# Patient Record
Sex: Male | Born: 1970 | Race: White | Hispanic: No | Marital: Married | Smoking: Former smoker
Health system: Southern US, Community
[De-identification: ages and names within clinical notes are randomized; demographics above are authoritative.]

## PROBLEM LIST (undated history)

## (undated) DIAGNOSIS — F32A Depression, unspecified: Secondary | ICD-10-CM

## (undated) DIAGNOSIS — F419 Anxiety disorder, unspecified: Secondary | ICD-10-CM

## (undated) HISTORY — PX: BACK SURGERY: SHX140

## (undated) HISTORY — DX: Anxiety disorder, unspecified: F41.9

## (undated) HISTORY — PX: CARPAL TUNNEL RELEASE: SHX101

## (undated) HISTORY — DX: Depression, unspecified: F32.A

---

## 2004-10-23 ENCOUNTER — Ambulatory Visit: Payer: Self-pay | Admitting: *Deleted

## 2005-08-01 ENCOUNTER — Emergency Department: Payer: Self-pay | Admitting: Emergency Medicine

## 2006-06-01 ENCOUNTER — Emergency Department: Payer: Self-pay | Admitting: Emergency Medicine

## 2006-06-02 ENCOUNTER — Other Ambulatory Visit: Payer: Self-pay

## 2006-08-13 ENCOUNTER — Emergency Department: Payer: Self-pay | Admitting: Emergency Medicine

## 2006-11-13 ENCOUNTER — Ambulatory Visit: Payer: Self-pay | Admitting: Unknown Physician Specialty

## 2006-11-20 ENCOUNTER — Ambulatory Visit: Payer: Self-pay | Admitting: Unknown Physician Specialty

## 2007-01-30 ENCOUNTER — Emergency Department: Payer: Self-pay | Admitting: Emergency Medicine

## 2010-01-07 ENCOUNTER — Emergency Department: Payer: Self-pay | Admitting: Internal Medicine

## 2012-10-26 ENCOUNTER — Ambulatory Visit: Payer: Self-pay | Admitting: Orthopedic Surgery

## 2012-11-09 ENCOUNTER — Ambulatory Visit: Payer: Self-pay | Admitting: Anesthesiology

## 2012-12-14 ENCOUNTER — Ambulatory Visit: Payer: Self-pay | Admitting: Anesthesiology

## 2014-12-26 ENCOUNTER — Emergency Department
Admission: EM | Admit: 2014-12-26 | Discharge: 2014-12-26 | Disposition: A | Discharge status: Home / Self Care | Payer: Medicare Other | Attending: Emergency Medicine | Admitting: Emergency Medicine

## 2014-12-26 ENCOUNTER — Encounter: Payer: Self-pay | Admitting: Emergency Medicine

## 2014-12-26 DIAGNOSIS — S39012A Strain of muscle, fascia and tendon of lower back, initial encounter: Secondary | ICD-10-CM | POA: Insufficient documentation

## 2014-12-26 DIAGNOSIS — X58XXXA Exposure to other specified factors, initial encounter: Secondary | ICD-10-CM | POA: Diagnosis not present

## 2014-12-26 DIAGNOSIS — S3992XA Unspecified injury of lower back, initial encounter: Secondary | ICD-10-CM | POA: Diagnosis present

## 2014-12-26 DIAGNOSIS — Z9889 Other specified postprocedural states: Secondary | ICD-10-CM | POA: Insufficient documentation

## 2014-12-26 DIAGNOSIS — Y998 Other external cause status: Secondary | ICD-10-CM | POA: Insufficient documentation

## 2014-12-26 DIAGNOSIS — Y9389 Activity, other specified: Secondary | ICD-10-CM | POA: Diagnosis not present

## 2014-12-26 DIAGNOSIS — Y9289 Other specified places as the place of occurrence of the external cause: Secondary | ICD-10-CM | POA: Insufficient documentation

## 2014-12-26 DIAGNOSIS — Z79899 Other long term (current) drug therapy: Secondary | ICD-10-CM | POA: Diagnosis not present

## 2014-12-26 MED ORDER — DIAZEPAM 2 MG PO TABS: 2.0000 mg | ORAL_TABLET | Freq: Three times a day (TID) | ORAL | Status: DC | PRN

## 2014-12-26 MED ORDER — OXYCODONE-ACETAMINOPHEN 5-325 MG PO TABS: 1.0000 | ORAL_TABLET | ORAL | Status: DC | PRN

## 2014-12-26 MED ORDER — DIAZEPAM 2 MG PO TABS
ORAL_TABLET | ORAL | Status: AC
  Filled 2014-12-26: qty 1

## 2014-12-26 MED ORDER — OXYCODONE-ACETAMINOPHEN 5-325 MG PO TABS
ORAL_TABLET | ORAL | Status: AC
  Filled 2014-12-26: qty 1

## 2014-12-26 MED ORDER — OXYCODONE-ACETAMINOPHEN 5-325 MG PO TABS
2.0000 | ORAL_TABLET | Freq: Once | ORAL | Status: AC
  Administered 2014-12-26: 2 via ORAL

## 2014-12-26 MED ORDER — DIAZEPAM 2 MG PO TABS
2.0000 mg | ORAL_TABLET | Freq: Once | ORAL | Status: AC
  Administered 2014-12-26: 2 mg via ORAL

## 2014-12-26 NOTE — Discharge Instructions (Signed)
° ° °  CALL YOUR DOCTOR IN [place] IF NOT IMPROVING IN 2-3 DAYS MOIST HEAT OR ICE TO BACK FREQUENTLY USE 2 PILLOWS UNDER YOUR KNEES IF YOU ARE SLEEPING ON YOUR BACK USE 1 PILLOW BETWEEN YOUR KNEES IF SLEEPING ON YOUR SIDE RETURN to emergency room if any loss of bowel or bladder function

## 2014-12-26 NOTE — ED Provider Notes (Signed)
[facility] Emergency Department Provider Note  ____________________________________________  Time seen: 1603  I have reviewed the triage vital signs and the nursing notes.   HISTORY  Chief Complaint Back Pain   HPI Aaron KeasRufus D Ebel Jr. is a 44 y.o. male comes in today with complaint of back pain for 4 days. Patient states that he and some buddies were moving a carport when his back began hurting. He is taken some over-the-counter medication without any relief. Pain increases with standing decreases only minimally lying down. He is unable to sleep due to discomfort. He denies any bowel or bladder incontinence. There is no radiation of pain. Currently his pain is 10 out of 10. 2 years ago he had back surgery in [place] but does not know the name of orthopedist. Since that time he has not required any pain medication.  History reviewed. No pertinent past medical history.  There are no active problems to display for this patient.   Past Surgical History  Procedure Laterality Date  . Back surgery      Current Outpatient Rx  Name  Route  Sig  Dispense  Refill  . DULoxetine (CYMBALTA) 30 MG capsule   Oral   Take 30 mg by mouth daily.         . QUEtiapine (SEROQUEL) 200 MG tablet   Oral   Take 200 mg by mouth at bedtime.         . diazepam (VALIUM) 2 MG tablet   Oral   Take 1 tablet (2 mg total) by mouth every 8 (eight) hours as needed for anxiety.   9 tablet   0   . oxyCODONE-acetaminophen (PERCOCET/ROXICET) 5-325 MG per tablet   Oral   Take 1-2 tablets by mouth every 4 (four) hours as needed for severe pain.   20 tablet   0     Allergies Review of patient's allergies indicates no known allergies.  No family history on file.  Social History History  Substance Use Topics  . Smoking status: Never Smoker   . Smokeless tobacco: Not on file  . Alcohol Use: Yes    Review of Systems Constitutional: No fever/chills Cardiovascular: Denies  chest pain. Respiratory: Denies shortness of breath. Gastrointestinal: No abdominal pain.  No nausea, no vomiting.  No diarrhea or bowel incontinence.  No constipation. Genitourinary: Negative for dysuria. Musculoskeletal: Positive for back pain. Skin: Negative for rash. Neurological: Negative for headaches, focal weakness or numbness. 10-point ROS otherwise negative.  ____________________________________________   PHYSICAL EXAM:  VITAL SIGNS: ED Triage Vitals  Enc Vitals Group     BP 12/26/14 1435 151/86 mmHg     Pulse Rate 12/26/14 1435 76     Resp 12/26/14 1435 16     Temp 12/26/14 1435 98.2 F (36.8 C)     Temp Source 12/26/14 1435 Oral     SpO2 12/26/14 1435 99 %     Weight 12/26/14 1435 245 lb (111.131 kg)     Height 12/26/14 1435 5\' 9"  (1.753 m)     Head Cir --      Peak Flow --      Pain Score 12/26/14 1436 10     Pain Loc --      Pain Edu? --      Excl. in GC? --     Constitutional: Alert and oriented. Well appearing and in no acute distress. Eyes: Conjunctivae are normal. PERRL. EOMI. Head: Atraumatic. Nose: No congestion/rhinnorhea. Neck: No stridor.  Supple Cardiovascular:  Normal rate, regular rhythm. Grossly normal heart sounds.  Good peripheral circulation. Respiratory: Normal respiratory effort.  No retractions. Lungs CTAB. Gastrointestinal: Soft and nontender. No distention. No abdominal bruits. No CVA tenderness. Musculoskeletal: No lower extremity tenderness nor edema.  No joint effusions. Back exam no gross deformity. Range of motion is restricted secondary to muscle spasms and pain. Moderate tenderness on palpation paravertebral muscles L5-S1 area. Straight leg raises are approximately 70 with discomfort bilaterally Neurologic:  Normal speech and language. No gross focal neurologic deficits are appreciated. Speech is normal. No gait instability. Reflexes lower extremities 2+ bilaterally Skin:  Skin is warm, dry and intact. No rash noted. Psychiatric:  Mood and affect are normal. Speech and behavior are normal.  ____________________________________________   LABS (all labs ordered are listed, but only abnormal results are displayed)  Labs Reviewed - No data to display  PROCEDURES  Procedure(s) performed: None  Critical Care performed: No  ____________________________________________   INITIAL IMPRESSION / ASSESSMENT AND PLAN / ED COURSE  Pertinent labs & imaging results that were available during my care of the patient were reviewed by me and considered in my medical decision making (see chart for details).  Patient is to follow-up with his orthopedist in [place]. He is reassured that this is mostly muscle related. He was offered injection of Toradol and is afraid of needles. He is given a prescription for Percocet 5 mg and diazepam 2 mg. he is also to use warm heat or ice to his lower back. ____________________________________________   FINAL CLINICAL IMPRESSION(S) / ED DIAGNOSES  Final diagnoses:  Low back strain, initial encounter      Tommi Rumps, PA-C 12/26/14 1632  Sharman Cheek, MD 12/27/14 1204

## 2014-12-26 NOTE — ED Notes (Signed)
Several days ago moving a metal carport developed low back pain

## 2014-12-26 NOTE — ED Notes (Signed)
Says moved a car port last Thursday.  Back is still hurting.  Increases with standing.

## 2015-05-21 ENCOUNTER — Emergency Department
Admission: EM | Admit: 2015-05-21 | Discharge: 2015-05-21 | Disposition: A | Discharge status: Home / Self Care | Payer: Medicare Other | Attending: Emergency Medicine | Admitting: Emergency Medicine

## 2015-05-21 ENCOUNTER — Encounter: Payer: Self-pay | Admitting: Emergency Medicine

## 2015-05-21 DIAGNOSIS — S0501XA Injury of conjunctiva and corneal abrasion without foreign body, right eye, initial encounter: Secondary | ICD-10-CM

## 2015-05-21 DIAGNOSIS — Y998 Other external cause status: Secondary | ICD-10-CM | POA: Insufficient documentation

## 2015-05-21 DIAGNOSIS — Y9389 Activity, other specified: Secondary | ICD-10-CM | POA: Insufficient documentation

## 2015-05-21 DIAGNOSIS — Z87891 Personal history of nicotine dependence: Secondary | ICD-10-CM | POA: Diagnosis not present

## 2015-05-21 DIAGNOSIS — S0591XA Unspecified injury of right eye and orbit, initial encounter: Secondary | ICD-10-CM | POA: Diagnosis present

## 2015-05-21 DIAGNOSIS — Y9289 Other specified places as the place of occurrence of the external cause: Secondary | ICD-10-CM | POA: Insufficient documentation

## 2015-05-21 DIAGNOSIS — T1501XA Foreign body in cornea, right eye, initial encounter: Secondary | ICD-10-CM | POA: Insufficient documentation

## 2015-05-21 DIAGNOSIS — Z79899 Other long term (current) drug therapy: Secondary | ICD-10-CM | POA: Insufficient documentation

## 2015-05-21 DIAGNOSIS — X58XXXA Exposure to other specified factors, initial encounter: Secondary | ICD-10-CM | POA: Insufficient documentation

## 2015-05-21 MED ORDER — GENTAMICIN SULFATE 0.3 % OP SOLN: 1.0000 [drp] | OPHTHALMIC | Status: DC

## 2015-05-21 MED ORDER — KETOROLAC TROMETHAMINE 0.5 % OP SOLN: 1.0000 [drp] | Freq: Four times a day (QID) | OPHTHALMIC | Status: DC

## 2015-05-21 MED ORDER — FLUORESCEIN SODIUM 1 MG OP STRP
1.0000 | ORAL_STRIP | Freq: Once | OPHTHALMIC | Status: AC
  Administered 2015-05-21: 1 via OPHTHALMIC
  Filled 2015-05-21: qty 1

## 2015-05-21 MED ORDER — TETRACAINE HCL 0.5 % OP SOLN
1.0000 [drp] | Freq: Once | OPHTHALMIC | Status: AC
  Administered 2015-05-21: 1 [drp] via OPHTHALMIC
  Filled 2015-05-21: qty 2
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
## 2015-05-21 NOTE — ED Notes (Signed)
Pt reports for past 2 days right eye redness and itching. Denies fever

## 2015-05-21 NOTE — Discharge Instructions (Signed)
Eye Foreign Body A foreign body is an object on or in the eye that should not be there. The object could be a speck of dirt or dust, a hair, an eyelash, a splinter, or any other object. HOME CARE  Take medicines only as told by your doctor. Use eye drops or ointment as told.  If no eye patch was put on:  Keep the eye closed as much as possible.  Do not rub the eye.  Wear dark glasses in bright light.  Do not wear contact lenses until the eye feels normal, or as told by your doctor.  Wear protective eye covering when needed, especially when using high-speed tools.  If your eye is patched:  Follow your doctor's instructions for when to remove the patch.  Do notdrive or use machines while the eye patch is on. Judging distances is hard to do while wearing a patch.  Keep all follow-up visits as told by your doctor. This is important. GET HELP IF:   Your pain gets worse.  Your vision gets worse.  You have problems with your eye patch.  You have fluid (discharge) coming from your eye.  You have redness and swelling around your eye. MAKE SURE YOU:   Understand these instructions.  Will watch your condition.  Will get help right away if you are not doing well or get worse.   This information is not intended to replace advice given to you by your health care provider. Make sure you discuss any questions you have with your health care provider.   Document Released: 01/09/2010 Document Revised: 08/12/2014 Document Reviewed: 12/17/2012 Elsevier Interactive Patient Education 2016 [employer]  Corneal Abrasion The cornea is the clear covering at the front and center of the eye. When you look at the colored portion of the eye, you are looking through the cornea. It is a thin tissue made up of layers. The top layer is the most sensitive layer. A corneal abrasion happens if this layer is scratched or an injury causes it to come off.  HOME CARE  You may be given drops or a  medicated cream. Use the medicine as told by your doctor.  A pressure patch may be put over the eye. If this is done, follow your doctor's instructions for when to remove the patch. Do not drive or use machines while the eye patch is on. Judging distances is hard to do with a patch on.  See your doctor for a follow-up exam if you are told to do so. It is very important that you keep this appointment. GET HELP IF:   You have pain, are sensitive to light, and have a scratchy feeling in one eye or both eyes.  Your pressure patch keeps getting loose. You can blink your eye under the patch.  You have fluid coming from your eye or the lids stick together in the morning.  You have the same symptoms in the morning that you did with the first abrasion. This could be days, weeks, or months after the first abrasion healed.   This information is not intended to replace advice given to you by your health care provider. Make sure you discuss any questions you have with your health care provider.   Document Released: 01/08/2008 Document Revised: 04/12/2015 Document Reviewed: 03/29/2013 Elsevier Interactive Patient Education 2016 [employer]   Use the eye drops as directed for corneal injury.  Follow-up with your eyecare provider or [facility].

## 2015-05-21 NOTE — ED Notes (Signed)
AAOx3.  Skin warm and dry.  NAD 

## 2015-05-21 NOTE — ED Notes (Signed)
Foreign body sensation to right eye. Conjunctiva red. No drainage. Patient states it gets worse if he "messes with it." No other sxs.

## 2015-07-30 ENCOUNTER — Encounter: Payer: Self-pay | Admitting: *Deleted

## 2015-07-30 ENCOUNTER — Emergency Department
Admission: EM | Admit: 2015-07-30 | Discharge: 2015-07-30 | Disposition: A | Discharge status: Home / Self Care | Payer: Medicare Other | Attending: Emergency Medicine | Admitting: Emergency Medicine

## 2015-07-30 DIAGNOSIS — Y9389 Activity, other specified: Secondary | ICD-10-CM | POA: Insufficient documentation

## 2015-07-30 DIAGNOSIS — Z87891 Personal history of nicotine dependence: Secondary | ICD-10-CM | POA: Insufficient documentation

## 2015-07-30 DIAGNOSIS — M7541 Impingement syndrome of right shoulder: Secondary | ICD-10-CM | POA: Diagnosis not present

## 2015-07-30 DIAGNOSIS — Y9289 Other specified places as the place of occurrence of the external cause: Secondary | ICD-10-CM | POA: Diagnosis not present

## 2015-07-30 DIAGNOSIS — Y998 Other external cause status: Secondary | ICD-10-CM | POA: Insufficient documentation

## 2015-07-30 DIAGNOSIS — W108XXA Fall (on) (from) other stairs and steps, initial encounter: Secondary | ICD-10-CM | POA: Insufficient documentation

## 2015-07-30 DIAGNOSIS — S4991XA Unspecified injury of right shoulder and upper arm, initial encounter: Secondary | ICD-10-CM | POA: Diagnosis present

## 2015-07-30 DIAGNOSIS — Z791 Long term (current) use of non-steroidal anti-inflammatories (NSAID): Secondary | ICD-10-CM | POA: Diagnosis not present

## 2015-07-30 DIAGNOSIS — Z792 Long term (current) use of antibiotics: Secondary | ICD-10-CM | POA: Diagnosis not present

## 2015-07-30 DIAGNOSIS — Z79899 Other long term (current) drug therapy: Secondary | ICD-10-CM | POA: Insufficient documentation

## 2015-07-30 MED ORDER — MELOXICAM 15 MG PO TABS: 15.0000 mg | ORAL_TABLET | Freq: Every day | ORAL | Status: DC

## 2015-07-30 NOTE — Discharge Instructions (Signed)
Impingement Syndrome, Rotator Cuff, Bursitis With Rehab °Impingement syndrome is a condition that involves inflammation of the tendons of the rotator cuff and the subacromial bursa, that causes pain in the shoulder. The rotator cuff consists of four tendons and muscles that control much of the shoulder and upper arm function. The subacromial bursa is a fluid filled sac that helps reduce friction between the rotator cuff and one of the bones of the shoulder (acromion). Impingement syndrome is usually an overuse injury that causes swelling of the bursa (bursitis), swelling of the tendon (tendonitis), and/or a tear of the tendon (strain). Strains are classified into three categories. Grade 1 strains cause pain, but the tendon is not lengthened. Grade 2 strains include a lengthened ligament, due to the ligament being stretched or partially ruptured. With grade 2 strains there is still function, although the function may be decreased. Grade 3 strains include a complete tear of the tendon or muscle, and function is usually impaired. °SYMPTOMS  °· Pain around the shoulder, often at the outer portion of the upper arm. °· Pain that gets worse with shoulder function, especially when reaching overhead or lifting. °· Sometimes, aching when not using the arm. °· Pain that wakes you up at night. °· Sometimes, tenderness, swelling, warmth, or redness over the affected area. °· Loss of strength. °· Limited motion of the shoulder, especially reaching behind the back (to the back pocket or to unhook bra) or across your body. °· Crackling sound (crepitation) when moving the arm. °· Biceps tendon pain and inflammation (in the front of the shoulder). Worse when bending the elbow or lifting. °CAUSES  °Impingement syndrome is often an overuse injury, in which chronic (repetitive) motions cause the tendons or bursa to become inflamed. A strain occurs when a force is paced on the tendon or muscle that is greater than it can withstand.  Common mechanisms of injury include: °Stress from sudden increase in duration, frequency, or intensity of training. °· Direct hit (trauma) to the shoulder. °· Aging, erosion of the tendon with normal use. °· Bony bump on shoulder (acromial spur). °RISK INCREASES WITH: °· Contact sports (football, wrestling, boxing). °· Throwing sports (baseball, tennis, volleyball). °· Weightlifting and bodybuilding. °· Heavy labor. °· Previous injury to the rotator cuff, including impingement. °· Poor shoulder strength and flexibility. °· Failure to warm up properly before activity. °· Inadequate protective equipment. °· Old age. °· Bony bump on shoulder (acromial spur). °PREVENTION  °· Warm up and stretch properly before activity. °· Allow for adequate recovery between workouts. °· Maintain physical fitness: °¨ Strength, flexibility, and endurance. °¨ Cardiovascular fitness. °· Learn and use proper exercise technique. °PROGNOSIS  °If treated properly, impingement syndrome usually goes away within 6 weeks. Sometimes surgery is required.  °RELATED COMPLICATIONS  °· Longer healing time if not properly treated, or if not given enough time to heal. °· Recurring symptoms, that result in a chronic condition. °· Shoulder stiffness, frozen shoulder, or loss of motion. °· Rotator cuff tendon tear. °· Recurring symptoms, especially if activity is resumed too soon, with overuse, with a direct blow, or when using poor technique. °TREATMENT  °Treatment first involves the use of ice and medicine, to reduce pain and inflammation. The use of strengthening and stretching exercises may help reduce pain with activity. These exercises may be performed at home or with a therapist. If non-surgical treatment is unsuccessful after more than 6 months, surgery may be advised. After surgery and rehabilitation, activity is usually possible in 3 months.  °  MEDICATION °· If pain medicine is needed, nonsteroidal anti-inflammatory medicines (aspirin and  ibuprofen), or other minor pain relievers (acetaminophen), are often advised. °· Do not take pain medicine for 7 days before surgery. °· Prescription pain relievers may be given, if your caregiver thinks they are needed. Use only as directed and only as much as you need. °· Corticosteroid injections may be given by your caregiver. These injections should be reserved for the most serious cases, because they may only be given a certain number of times. °HEAT AND COLD °· Cold treatment (icing) should be applied for 10 to 15 minutes every 2 to 3 hours for inflammation and pain, and immediately after activity that aggravates your symptoms. Use ice packs or an ice massage. °· Heat treatment may be used before performing stretching and strengthening activities prescribed by your caregiver, physical therapist, or athletic trainer. Use a heat pack or a warm water soak. °SEEK MEDICAL CARE IF:  °· Symptoms get worse or do not improve in 4 to 6 weeks, despite treatment. °· New, unexplained symptoms develop. (Drugs used in treatment may produce side effects.) ° °
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
## 2015-07-30 NOTE — ED Provider Notes (Signed)
[facility] Emergency Department Provider Note  ____________________________________________  Time seen: Approximately 1:37 PM  I have reviewed the triage vital signs and the nursing notes.   HISTORY  Chief Complaint Shoulder Injury    HPI Aaron KeasRufus D Lando Jr. is a 44 y.o. male who presents to emergency department complaining of right-sided shoulder pain. He states that he has a history of 2 injuries to right shoulder in the last month. He initially fell off a 8 foot building onto the right shoulder. He was not evaluated at that time and states that the pain essentially resolved. He states that over the last week he was coming down a flight of stairs when he slipped on a patch of ice falling and grabbing with his right arm/shoulder. He states he began to have a burning sensation to the anterior aspect of right shoulder. Yesterday he was trying to free his bull from a hay ringand is now expressing increased symptoms to anterior right shoulder. He denies any numbness or tingling. He denies any loss of grip strength.   History reviewed. No pertinent past medical history.  There are no active problems to display for this patient.   Past Surgical History  Procedure Laterality Date  . Back surgery      Current Outpatient Rx  Name  Route  Sig  Dispense  Refill  . diazepam (VALIUM) 2 MG tablet   Oral   Take 1 tablet (2 mg total) by mouth every 8 (eight) hours as needed for anxiety.   9 tablet   0   . DULoxetine (CYMBALTA) 30 MG capsule   Oral   Take 30 mg by mouth daily.         Marland Kitchen. gentamicin (GARAMYCIN) 0.3 % ophthalmic solution   Right Eye   Place 1 drop into the right eye every 4 (four) hours.   5 mL   0   . ketorolac (ACULAR) 0.5 % ophthalmic solution   Right Eye   Place 1 drop into the right eye 4 (four) times daily.   5 mL   0   . meloxicam (MOBIC) 15 MG tablet   Oral   Take 1 tablet (15 mg total) by mouth daily.   30 tablet   0   .  oxyCODONE-acetaminophen (PERCOCET/ROXICET) 5-325 MG per tablet   Oral   Take 1-2 tablets by mouth every 4 (four) hours as needed for severe pain.   20 tablet   0   . QUEtiapine (SEROQUEL) 200 MG tablet   Oral   Take 200 mg by mouth at bedtime.           Allergies Review of patient's allergies indicates no known allergies.  History reviewed. No pertinent family history.  Social History Social History  Substance Use Topics  . Smoking status: Former Games developermoker  . Smokeless tobacco: Never Used  . Alcohol Use: 1.8 oz/week    3 Cans of beer per week    Review of Systems Constitutional: No fever/chills Eyes: No visual changes. ENT: No sore throat. Cardiovascular: Denies chest pain. Respiratory: Denies shortness of breath. Gastrointestinal: No abdominal pain.  No nausea, no vomiting.  No diarrhea.  No constipation. Genitourinary: Negative for dysuria. Musculoskeletal: Negative for back pain. Endorses right shoulder pain. Skin: Negative for rash. Neurological: Negative for headaches, focal weakness or numbness.  10-point ROS otherwise negative.  ____________________________________________   PHYSICAL EXAM:  VITAL SIGNS: ED Triage Vitals  Enc Vitals Group     BP 07/30/15 1259  158/87 mmHg     Pulse Rate 07/30/15 1259 89     Resp 07/30/15 1259 16     Temp 07/30/15 1259 97.7 F (36.5 C)     Temp Source 07/30/15 1259 Oral     SpO2 07/30/15 1259 97 %     Weight 07/30/15 1259 260 lb (117.935 kg)     Height 07/30/15 1259  (1.753 m)     Head Cir --      Peak Flow --      Pain Score 07/30/15 1336 7     Pain Loc --      Pain Edu? --      Excl. in GC? --     Constitutional: Alert and oriented. Well appearing and in no acute distress. Eyes: Conjunctivae are normal. PERRL. EOMI. Head: Atraumatic. Nose: No congestion/rhinnorhea. Mouth/Throat: Mucous membranes are moist.  Oropharynx non-erythematous. Neck: No stridor.   Cardiovascular: Normal rate, regular rhythm.  Grossly normal heart sounds.  Good peripheral circulation. Respiratory: Normal respiratory effort.  No retractions. Lungs CTAB. Gastrointestinal: Soft and nontender. No distention. No abdominal bruits. No CVA tenderness. Musculoskeletal: No lower extremity tenderness nor edema.  No joint effusions. Patient is tender to palpation over the [facility] joint in R shoulder.  no visible deformity, ecchymosis, contusion, abrasion, laceration noted to shoulder. Patient has limited range of motion due to pain. Neers test is positive. Sensation and pulses are intact distally. Neurologic:  Normal speech and language. No gross focal neurologic deficits are appreciated. No gait instability. Skin:  Skin is warm, dry and intact. No rash noted. Psychiatric: Mood and affect are normal. Speech and behavior are normal.  ____________________________________________   LABS (all labs ordered are listed, but only abnormal results are displayed)  Labs Reviewed - No data to display ____________________________________________  EKG   ____________________________________________  RADIOLOGY   ____________________________________________   PROCEDURES  Procedure(s) performed: None  Critical Care performed: No  ____________________________________________   INITIAL IMPRESSION / ASSESSMENT AND PLAN / ED COURSE  Pertinent labs & imaging results that were available during my care of the patient were reviewed by me and considered in my medical decision making (see chart for details)   Patient's diagnosis is consistent with impingement syndrome or shoulder. I'll place the patient on strong anti-inflammatory medications. Patient will take same for 2-3 weeks and then follow-up with orthopedics if symptoms are persisting. Patient verbalizes understanding of diagnosis and treatment plan and verbalizes compliance with same.   New Prescriptions   MELOXICAM (MOBIC) 15 MG TABLET    Take 1 tablet (15 mg total) by mouth  daily.      ___________   FINAL CLINICAL IMPRESSION(S) / ED DIAGNOSES  Final diagnoses:  Shoulder impingement syndrome, right      Racheal Patches, PA-C 07/30/15 1351  Jene Every, MD 07/30/15 1407

## 2015-12-30 ENCOUNTER — Emergency Department
Admission: EM | Admit: 2015-12-30 | Discharge: 2015-12-30 | Disposition: A | Discharge status: Home / Self Care | Payer: Medicare Other | Attending: Emergency Medicine | Admitting: Emergency Medicine

## 2015-12-30 ENCOUNTER — Encounter: Payer: Self-pay | Admitting: Emergency Medicine

## 2015-12-30 DIAGNOSIS — Z791 Long term (current) use of non-steroidal anti-inflammatories (NSAID): Secondary | ICD-10-CM | POA: Diagnosis not present

## 2015-12-30 DIAGNOSIS — Y929 Unspecified place or not applicable: Secondary | ICD-10-CM | POA: Insufficient documentation

## 2015-12-30 DIAGNOSIS — Y999 Unspecified external cause status: Secondary | ICD-10-CM | POA: Insufficient documentation

## 2015-12-30 DIAGNOSIS — Z87891 Personal history of nicotine dependence: Secondary | ICD-10-CM | POA: Diagnosis not present

## 2015-12-30 DIAGNOSIS — W458XXA Other foreign body or object entering through skin, initial encounter: Secondary | ICD-10-CM | POA: Insufficient documentation

## 2015-12-30 DIAGNOSIS — Y9389 Activity, other specified: Secondary | ICD-10-CM | POA: Diagnosis not present

## 2015-12-30 DIAGNOSIS — Z792 Long term (current) use of antibiotics: Secondary | ICD-10-CM | POA: Insufficient documentation

## 2015-12-30 DIAGNOSIS — H5712 Ocular pain, left eye: Secondary | ICD-10-CM | POA: Diagnosis present

## 2015-12-30 DIAGNOSIS — Z79899 Other long term (current) drug therapy: Secondary | ICD-10-CM | POA: Diagnosis not present

## 2015-12-30 DIAGNOSIS — S0502XA Injury of conjunctiva and corneal abrasion without foreign body, left eye, initial encounter: Secondary | ICD-10-CM | POA: Insufficient documentation

## 2015-12-30 MED ORDER — TETRACAINE HCL 0.5 % OP SOLN
2.0000 [drp] | Freq: Once | OPHTHALMIC | Status: DC
  Filled 2015-12-30: qty 2

## 2015-12-30 MED ORDER — FLUORESCEIN SODIUM 1 MG OP STRP
1.0000 | ORAL_STRIP | Freq: Once | OPHTHALMIC | Status: DC
  Filled 2015-12-30: qty 1

## 2015-12-30 NOTE — Discharge Instructions (Signed)

## 2015-12-30 NOTE — ED Provider Notes (Signed)
[facility] Emergency Department Provider Note  ____________________________________________  Time seen: Approximately 3:41 PM  I have reviewed the triage vital signs and the nursing notes.   HISTORY  Chief Complaint Eye Pain    HPI Aaron KeasRufus D Nierman Jr. is a 45 y.o. male who presents to the emergency department complaining of left eye irritation 2-3 days. Patient states that he was using a grinding machine but was wearing safety goggles. Patient states that he believes he got something in his eye. He rinsed the eye out and believes that the irritant has been removed but states that he has a itchy/scratchy sensation in the medial aspect of the left eye. Patient denies any visual changes. He denies any headache. He denies any purulent drainage from the eye.   History reviewed. No pertinent past medical history.  There are no active problems to display for this patient.   Past Surgical History  Procedure Laterality Date  . Back surgery    . Carpal tunnel release      Current Outpatient Rx  Name  Route  Sig  Dispense  Refill  . diazepam (VALIUM) 2 MG tablet   Oral   Take 1 tablet (2 mg total) by mouth every 8 (eight) hours as needed for anxiety.   9 tablet   0   . DULoxetine (CYMBALTA) 30 MG capsule   Oral   Take 30 mg by mouth daily.         Marland Kitchen. gentamicin (GARAMYCIN) 0.3 % ophthalmic solution   Right Eye   Place 1 drop into the right eye every 4 (four) hours.   5 mL   0   . ketorolac (ACULAR) 0.5 % ophthalmic solution   Right Eye   Place 1 drop into the right eye 4 (four) times daily.   5 mL   0   . meloxicam (MOBIC) 15 MG tablet   Oral   Take 1 tablet (15 mg total) by mouth daily.   30 tablet   0   . oxyCODONE-acetaminophen (PERCOCET/ROXICET) 5-325 MG per tablet   Oral   Take 1-2 tablets by mouth every 4 (four) hours as needed for severe pain.   20 tablet   0   . QUEtiapine (SEROQUEL) 200 MG tablet   Oral   Take 200 mg by mouth  at bedtime.           Allergies Review of patient's allergies indicates no known allergies.  No family history on file.  Social History Social History  Substance Use Topics  . Smoking status: Former Games developermoker  . Smokeless tobacco: Never Used  . Alcohol Use: 1.8 oz/week    3 Cans of beer per week     Review of Systems  Constitutional: No fever/chills Eyes: No visual changes. No discharge. Irritation to left eye. ENT: No upper respiratory complaints. Cardiovascular: no chest pain. Respiratory: no cough. No SOB. Musculoskeletal: Negative for musculoskeletal pain. Skin: Negative for rash, abrasions, lacerations, ecchymosis. Neurological: Negative for headaches, focal weakness or numbness. 10-point ROS otherwise negative.  ____________________________________________   PHYSICAL EXAM:  VITAL SIGNS: ED Triage Vitals  Enc Vitals Group     BP 12/30/15 1455 150/88 mmHg     Pulse Rate 12/30/15 1455 81     Resp 12/30/15 1455 20     Temp 12/30/15 1455 97.4 F (36.3 C)     Temp Source 12/30/15 1455 Oral     SpO2 12/30/15 1455 98 %     Weight 12/30/15 1455  250 lb (113.399 kg)     Height 12/30/15 1455  (1.753 m)     Head Cir --      Peak Flow --      Pain Score 12/30/15 1456 4     Pain Loc --      Pain Edu? --      Excl. in GC? --      Constitutional: Alert and oriented. Well appearing and in no acute distress. Eyes: Conjunctivae are normal. PERRL. EOMI.Funduscopic exam reveals no corneal abnormality. No visible foreign body. Red reflex present bilaterally. Vasculature and optic disc unremarkable bilaterally. Patient's eye was anesthetized using tetracaine and fluorescein stain applied. There is a small corneal abrasion in the 6:00 position. No visible foreign body. Head: Atraumatic. Cardiovascular: Normal rate, regular rhythm. Normal S1 and S2.  Good peripheral circulation. Respiratory: Normal respiratory effort without tachypnea or retractions. Lungs CTAB. Good air  entry to the bases with no decreased or absent breath sounds. Musculoskeletal: Full range of motion to all extremities. No gross deformities appreciated. Neurologic:  Normal speech and language. No gross focal neurologic deficits are appreciated.  Skin:  Skin is warm, dry and intact. No rash noted. Psychiatric: Mood and affect are normal. Speech and behavior are normal. Patient exhibits appropriate insight and judgement.   ____________________________________________   LABS (all labs ordered are listed, but only abnormal results are displayed)  Labs Reviewed - No data to display ____________________________________________  EKG   ____________________________________________  RADIOLOGY   No results found.  ____________________________________________    PROCEDURES  Procedure(s) performed:       Medications  fluorescein ophthalmic strip 1 strip (not administered)  tetracaine (PONTOCAINE) 0.5 % ophthalmic solution 2 drop (not administered)     ____________________________________________   INITIAL IMPRESSION / ASSESSMENT AND PLAN / ED COURSE  Pertinent labs & imaging results that were available during my care of the patient were reviewed by me and considered in my medical decision making (see chart for details).  Patient's diagnosis is consistent with Corneal abrasion. This appears to be healing appropriately. Patient is advised to use Visine eyedrops. He'll follow up with ophthalmology should he continue to have symptoms.  Patient is given ED precautions to return to the ED for any worsening or new symptoms.     ____________________________________________  FINAL CLINICAL IMPRESSION(S) / ED DIAGNOSES  Final diagnoses:  Corneal abrasion, left, initial encounter      NEW MEDICATIONS STARTED DURING THIS VISIT:  New Prescriptions   No medications on file        This chart was dictated using voice recognition software/Dragon. Despite best efforts  to proofread, errors can occur which can change the meaning. Any change was purely unintentional.    Racheal Patches, PA-C 12/30/15 1547  Jeanmarie Plant, MD 12/31/15 [phone]

## 2015-12-30 NOTE — ED Notes (Signed)
Irritation L eye x 3 days, thinks may have gotten something in eye.

## 2016-01-25 DIAGNOSIS — F3170 Bipolar disorder, currently in remission, most recent episode unspecified: Secondary | ICD-10-CM | POA: Insufficient documentation

## 2016-01-25 DIAGNOSIS — I10 Essential (primary) hypertension: Secondary | ICD-10-CM | POA: Insufficient documentation

## 2016-01-25 DIAGNOSIS — Z7185 Encounter for immunization safety counseling: Secondary | ICD-10-CM | POA: Insufficient documentation

## 2016-06-25 ENCOUNTER — Other Ambulatory Visit: Payer: Self-pay | Admitting: Family Medicine

## 2016-06-25 DIAGNOSIS — E782 Mixed hyperlipidemia: Secondary | ICD-10-CM | POA: Insufficient documentation

## 2016-06-25 DIAGNOSIS — M5412 Radiculopathy, cervical region: Secondary | ICD-10-CM

## 2016-07-12 ENCOUNTER — Ambulatory Visit: Admission: RE | Admit: 2016-07-12 | Payer: Medicare Other | Source: Ambulatory Visit

## 2016-07-23 ENCOUNTER — Ambulatory Visit
Admission: RE | Admit: 2016-07-23 | Discharge: 2016-07-23 | Disposition: A | Discharge status: Home / Self Care | Payer: Medicare Other | Source: Ambulatory Visit | Attending: Family Medicine | Admitting: Family Medicine

## 2016-07-23 DIAGNOSIS — M50221 Other cervical disc displacement at C4-C5 level: Secondary | ICD-10-CM | POA: Diagnosis not present

## 2016-07-23 DIAGNOSIS — M5412 Radiculopathy, cervical region: Secondary | ICD-10-CM | POA: Diagnosis not present

## 2016-07-23 DIAGNOSIS — M4802 Spinal stenosis, cervical region: Secondary | ICD-10-CM | POA: Insufficient documentation

## 2018-06-10 DIAGNOSIS — N528 Other male erectile dysfunction: Secondary | ICD-10-CM | POA: Insufficient documentation

## 2019-03-29 ENCOUNTER — Other Ambulatory Visit: Payer: Self-pay | Admitting: Orthopedic Surgery

## 2019-03-29 DIAGNOSIS — M4726 Other spondylosis with radiculopathy, lumbar region: Secondary | ICD-10-CM

## 2019-03-29 DIAGNOSIS — M4802 Spinal stenosis, cervical region: Secondary | ICD-10-CM

## 2019-04-21 ENCOUNTER — Other Ambulatory Visit: Payer: Self-pay | Admitting: *Deleted

## 2019-04-21 DIAGNOSIS — Z20822 Contact with and (suspected) exposure to covid-19: Secondary | ICD-10-CM

## 2019-04-22 LAB — NOVEL CORONAVIRUS, NAA: SARS-CoV-2, NAA: NOT DETECTED

## 2019-04-23 ENCOUNTER — Ambulatory Visit
Admission: RE | Admit: 2019-04-23 | Discharge: 2019-04-23 | Disposition: A | Discharge status: Home / Self Care | Payer: Medicare Other | Source: Ambulatory Visit | Attending: Orthopedic Surgery | Admitting: Orthopedic Surgery

## 2019-04-23 ENCOUNTER — Other Ambulatory Visit: Payer: Self-pay

## 2019-04-23 DIAGNOSIS — M4726 Other spondylosis with radiculopathy, lumbar region: Secondary | ICD-10-CM

## 2019-04-23 DIAGNOSIS — M4802 Spinal stenosis, cervical region: Secondary | ICD-10-CM

## 2019-05-13 ENCOUNTER — Other Ambulatory Visit: Payer: Self-pay

## 2019-05-13 DIAGNOSIS — Z20822 Contact with and (suspected) exposure to covid-19: Secondary | ICD-10-CM

## 2019-05-14 LAB — NOVEL CORONAVIRUS, NAA: SARS-CoV-2, NAA: NOT DETECTED

## 2019-06-07 DIAGNOSIS — M4722 Other spondylosis with radiculopathy, cervical region: Secondary | ICD-10-CM | POA: Insufficient documentation

## 2019-08-30 DIAGNOSIS — M961 Postlaminectomy syndrome, not elsewhere classified: Secondary | ICD-10-CM | POA: Insufficient documentation

## 2019-08-31 DIAGNOSIS — E669 Obesity, unspecified: Secondary | ICD-10-CM | POA: Insufficient documentation

## 2019-08-31 DIAGNOSIS — Z9989 Dependence on other enabling machines and devices: Secondary | ICD-10-CM | POA: Insufficient documentation

## 2019-08-31 DIAGNOSIS — G4733 Obstructive sleep apnea (adult) (pediatric): Secondary | ICD-10-CM | POA: Insufficient documentation

## 2019-10-26 ENCOUNTER — Other Ambulatory Visit: Payer: Self-pay | Admitting: Orthopedic Surgery

## 2019-10-26 DIAGNOSIS — M4326 Fusion of spine, lumbar region: Secondary | ICD-10-CM

## 2019-10-26 DIAGNOSIS — M4716 Other spondylosis with myelopathy, lumbar region: Secondary | ICD-10-CM

## 2019-11-04 ENCOUNTER — Other Ambulatory Visit: Payer: Self-pay

## 2019-11-04 ENCOUNTER — Ambulatory Visit
Admission: RE | Admit: 2019-11-04 | Discharge: 2019-11-04 | Disposition: A | Discharge status: Home / Self Care | Payer: Medicare Other | Source: Ambulatory Visit | Attending: Orthopedic Surgery | Admitting: Orthopedic Surgery

## 2019-11-04 DIAGNOSIS — M4326 Fusion of spine, lumbar region: Secondary | ICD-10-CM

## 2019-11-04 DIAGNOSIS — M4716 Other spondylosis with myelopathy, lumbar region: Secondary | ICD-10-CM

## 2019-11-04 LAB — POCT I-STAT CREATININE: Creatinine, Ser: 1 mg/dL (ref 0.61–1.24)

## 2019-11-04 MED ORDER — GADOBUTROL 1 MMOL/ML IV SOLN
10.0000 mL | Freq: Once | INTRAVENOUS | Status: AC | PRN
  Administered 2019-11-04: 10 mL via INTRAVENOUS

## 2019-11-08 ENCOUNTER — Other Ambulatory Visit: Payer: Self-pay | Admitting: Orthopedic Surgery

## 2019-11-08 DIAGNOSIS — M4326 Fusion of spine, lumbar region: Secondary | ICD-10-CM

## 2019-11-11 ENCOUNTER — Other Ambulatory Visit: Payer: Self-pay

## 2019-11-11 ENCOUNTER — Ambulatory Visit
Admission: RE | Admit: 2019-11-11 | Discharge: 2019-11-11 | Disposition: A | Discharge status: Home / Self Care | Payer: Medicare Other | Source: Ambulatory Visit | Attending: Orthopedic Surgery | Admitting: Orthopedic Surgery

## 2019-11-11 DIAGNOSIS — M4326 Fusion of spine, lumbar region: Secondary | ICD-10-CM | POA: Insufficient documentation

## 2020-02-08 DIAGNOSIS — Z0000 Encounter for general adult medical examination without abnormal findings: Secondary | ICD-10-CM | POA: Insufficient documentation

## 2020-06-17 ENCOUNTER — Encounter: Payer: Self-pay | Admitting: Emergency Medicine

## 2020-06-17 ENCOUNTER — Emergency Department
Admission: EM | Admit: 2020-06-17 | Discharge: 2020-06-17 | Disposition: A | Discharge status: Home / Self Care | Payer: Medicare Other | Attending: Emergency Medicine | Admitting: Emergency Medicine

## 2020-06-17 ENCOUNTER — Other Ambulatory Visit: Payer: Self-pay

## 2020-06-17 DIAGNOSIS — Z87891 Personal history of nicotine dependence: Secondary | ICD-10-CM | POA: Insufficient documentation

## 2020-06-17 DIAGNOSIS — K625 Hemorrhage of anus and rectum: Secondary | ICD-10-CM | POA: Diagnosis present

## 2020-06-17 DIAGNOSIS — K644 Residual hemorrhoidal skin tags: Secondary | ICD-10-CM | POA: Diagnosis not present

## 2020-06-17 LAB — CBC
HCT: 42 % (ref 39.0–52.0)
Hemoglobin: 14.6 g/dL (ref 13.0–17.0)
MCH: 30.5 pg (ref 26.0–34.0)
MCHC: 34.8 g/dL (ref 30.0–36.0)
MCV: 87.9 fL (ref 80.0–100.0)
Platelets: 196 10*3/uL (ref 150–400)
RBC: 4.78 MIL/uL (ref 4.22–5.81)
RDW: 12.5 % (ref 11.5–15.5)
WBC: 5.6 10*3/uL (ref 4.0–10.5)
nRBC: 0 % (ref 0.0–0.2)

## 2020-06-17 LAB — COMPREHENSIVE METABOLIC PANEL
ALT: 27 U/L (ref 0–44)
AST: 26 U/L (ref 15–41)
Albumin: 4.5 g/dL (ref 3.5–5.0)
Alkaline Phosphatase: 64 U/L (ref 38–126)
Anion gap: 10 (ref 5–15)
BUN: 14 mg/dL (ref 6–20)
CO2: 25 mmol/L (ref 22–32)
Calcium: 9.4 mg/dL (ref 8.9–10.3)
Chloride: 101 mmol/L (ref 98–111)
Creatinine, Ser: 1.08 mg/dL (ref 0.61–1.24)
GFR, Estimated: >60 mL/min (ref >60)
Glucose, Bld: 106 mg/dL — ABNORMAL HIGH (ref 70–99)
Potassium: 3.9 mmol/L (ref 3.5–5.1)
Sodium: 136 mmol/L (ref 135–145)
Total Bilirubin: 0.8 mg/dL (ref 0.3–1.2)
Total Protein: 7.4 g/dL (ref 6.5–8.1)

## 2020-06-17 LAB — TYPE AND SCREEN
ABO/RH(D): AB NEG
Antibody Screen: NEGATIVE

## 2020-06-17 MED ORDER — BENZOCAINE (TOPICAL) 20 % EX OINT: 1.0000 "application " | TOPICAL_OINTMENT | Freq: Four times a day (QID) | CUTANEOUS | 0 refills | Status: DC | PRN

## 2020-06-17 NOTE — ED Provider Notes (Signed)
[facility] Emergency Department Provider Note   ____________________________________________   None    (approximate)  I have reviewed the triage vital signs and the nursing notes.   HISTORY  Chief Complaint Rectal Bleeding    HPI Aaron Crosby. is a 49 y.o. male   reports no major past medical history.  Patient has noticed over the last 3 days that when he wipes after defecating or before defecating he noticed a small amount of blood.  Today he had blood soaking into his underwear when sitting down.  He denies any pain no abdominal pain no fevers or chills.  Reports he just showed up.  There is a small area or a bump or something around his rectum that he is noticed,.  This started after he had strained hard to have a bowel movement about 3 or 4 days ago  No recent illness.  No fevers.  Did not take any blood thinners.  No previous colonoscopy.  Denies any history of bleeding in the past  History reviewed. No pertinent past medical history.  There are no problems to display for this patient.   Past Surgical History:  Procedure Laterality Date  . BACK SURGERY    . CARPAL TUNNEL RELEASE      Prior to Admission medications   Medication Sig Start Date End Date Taking? Authorizing Provider         diazepam (VALIUM) 2 MG tablet Take 1 tablet (2 mg total) by mouth every 8 (eight) hours as needed for anxiety. 12/26/14   Tommi Rumps, PA-C  DULoxetine (CYMBALTA) 30 MG capsule Take 30 mg by mouth daily.    [provider]  gentamicin (GARAMYCIN) 0.3 % ophthalmic solution Place 1 drop into the right eye every 4 (four) hours. 05/21/15   Menshew, Charlesetta Ivory, PA-C  ketorolac (ACULAR) 0.5 % ophthalmic solution Place 1 drop into the right eye 4 (four) times daily. 05/21/15   Menshew, Charlesetta Ivory, PA-C  meloxicam (MOBIC) 15 MG tablet Take 1 tablet (15 mg total) by mouth daily. 07/30/15   Cuthriell, Delorise Royals, PA-C  oxyCODONE-acetaminophen  (PERCOCET/ROXICET) 5-325 MG per tablet Take 1-2 tablets by mouth every 4 (four) hours as needed for severe pain. 12/26/14   Tommi Rumps, PA-C  QUEtiapine (SEROQUEL) 200 MG tablet Take 200 mg by mouth at bedtime.    [provider]    Allergies Patient has no known allergies.  History reviewed. No pertinent family history.  Social History Social History   Tobacco Use  . Smoking status: Former Games developer  . Smokeless tobacco: Never Used  Substance Use Topics  . Alcohol use: Yes    Alcohol/week: 3.0 standard drinks    Types: 3 Cans of beer per week  . Drug use: No    Review of Systems Constitutional: No fever/chills Eyes: No visual changes. ENT: No sore throat. Cardiovascular: Denies chest pain. Respiratory: Denies shortness of breath. Gastrointestinal: No abdominal pain.  See HPI Genitourinary: Negative for dysuria. Musculoskeletal: Chronic back pain Skin: Negative for rash. Neurological: Negative for headaches, areas of focal weakness or numbness.    ____________________________________________   PHYSICAL EXAM:  VITAL SIGNS: ED Triage Vitals  Enc Vitals Group     BP 06/17/20 1456 (!) 158/82     Pulse Rate 06/17/20 1456 87     Resp 06/17/20 1456 20     Temp 06/17/20 1456 98.2 F (36.8 C)     Temp Source 06/17/20 1456 Oral  SpO2 06/17/20 1456 98 %     Weight 06/17/20 1457 250 lb (113.4 kg)     Height 06/17/20 1457 5\' 10"  (1.778 m)     Head Circumference --      Peak Flow --      Pain Score 06/17/20 1456 0     Pain Loc --      Pain Edu? --      Excl. in GC? --     Constitutional: Alert and oriented. Well appearing and in no acute distress. Eyes: Conjunctivae are normal. Head: Atraumatic. Nose: No congestion/rhinnorhea. Mouth/Throat: Mucous membranes are moist. Neck: No stridor.  Cardiovascular: Normal rate, regular rhythm. Grossly normal heart sounds.  Good peripheral circulation. Respiratory: Normal respiratory effort.  No retractions.  Lungs CTAB. Gastrointestinal: Soft and nontender. No distention. Rectal exam external demonstrates a small hemorrhoid with a 6 very small amount of dried blood around it.  No active rectal bleeding no blood coming from the rectum itself Musculoskeletal: No lower extremity tenderness nor edema. Neurologic:  Normal speech and language. No gross focal neurologic deficits are appreciated.  Skin:  Skin is warm, dry and intact. No rash noted. Psychiatric: Mood and affect are normal. Speech and behavior are normal.  ____________________________________________   LABS (all labs ordered are listed, but only abnormal results are displayed)  Labs Reviewed  COMPREHENSIVE METABOLIC PANEL - Abnormal; Notable for the following components:      Result Value   Glucose, Bld 106 (*)    All other components within normal limits  CBC  POC OCCULT BLOOD, ED  TYPE AND SCREEN   ____________________________________________  EKG   ____________________________________________  RADIOLOGY   ____________________________________________   PROCEDURES  Procedure(s) performed: None  Procedures  Critical Care performed: No  ____________________________________________   INITIAL IMPRESSION / ASSESSMENT AND PLAN / ED COURSE  Pertinent labs & imaging results that were available during my care of the patient were reviewed by me and considered in my medical decision making (see chart for details).   External hemorrhoid.  Appears to be source of bleeding.  Normal hemoglobin hemodynamically stable.  No associated abdominal pain fevers chills or systemic symptoms.  Discussed with patient, he is using Preparation H, also will use barrier cream and prescription benzocaine that he will apply locally to the area for pain relief.  Recommendation of follow-up with general surgery.  No signs or symptoms of acute intra-abdominal etiology or major GI bleeding.  This appears consistent with bleeding from external  hemorrhoid.  Follow-up with primary care as well as general surgery recommended    Return precautions and treatment recommendations and follow-up discussed with the patient who is agreeable with the plan.   ____________________________________________   FINAL CLINICAL IMPRESSION(S) / ED DIAGNOSES  Final diagnoses:  External hemorrhoids        Note:  This document was prepared using Dragon voice recognition software and may include unintentional dictation errors       06/19/20, MD 06/17/20 1805

## 2020-06-17 NOTE — ED Triage Notes (Addendum)
Pt presents to ED via POV with c/o frank rectal bleeding. Pt states started 4 days ago. Pt states woke up Tuesday morning with stabbing pain in his rectum then noted bleeding. Pt states bleeding has progressively worsened, states is now bleeding through his clothes. Pt states is currently wearing a pad due to amount of bleeding.   Pt denies hx of hemorrhoids, pt with noted blood to a panty liner placed in his boxers. Pt denies abdominal pain at this time, denies constipation.

## 2020-06-19 ENCOUNTER — Telehealth: Payer: Self-pay

## 2020-06-19 NOTE — Telephone Encounter (Signed)
Spoke with patient's wife and made her aware that Dr.Piscoya would like for patient to come in for an follow up appointment for bleeding hemorrhoids that he was seen in the emergency room for on Saturday. Patient's wife states patient is still experiencing the bleeding, but states she wanted to speak with her husband before making a follow up appointment and would give our office a call back with a date and time he would like to come in for a follow up appointment.

## 2020-06-21 ENCOUNTER — Ambulatory Visit (INDEPENDENT_AMBULATORY_CARE_PROVIDER_SITE_OTHER): Payer: Medicare Other | Admitting: Surgery

## 2020-06-21 ENCOUNTER — Encounter: Payer: Self-pay | Admitting: Surgery

## 2020-06-21 ENCOUNTER — Other Ambulatory Visit: Payer: Self-pay

## 2020-06-21 VITALS — BP 160/95 | HR 74 | Temp 98.0°F | Ht 70.0 in | Wt 251.0 lb

## 2020-06-21 DIAGNOSIS — K645 Perianal venous thrombosis: Secondary | ICD-10-CM

## 2020-06-21 MED ORDER — HYDROCORTISONE (PERIANAL) 2.5 % EX CREA: 1.0000 "application " | TOPICAL_CREAM | Freq: Two times a day (BID) | CUTANEOUS | 0 refills | Status: DC

## 2020-06-21 MED ORDER — LIDOCAINE 5 % EX OINT: 1.0000 "application " | TOPICAL_OINTMENT | CUTANEOUS | 0 refills | Status: DC | PRN

## 2020-06-21 NOTE — Progress Notes (Signed)
06/21/2020  Reason for Visit:  External hemorrhoids  History of Present Illness: Aaron Crosby. is a 49 y.o. male presenting for evaluation of external hemorrhoids.  The patient presented to the ED on 06/17/20 for this.  He overall has a 1 week history of perianal discomfort, associated with bleeding.  He reports that there is some pain involved, but is not severe.  His main concern is the bleeding, which he's noticed blood in his underwear and has passed through to his clothes before.  He reports he's wearing a diaper to help with the bleeding.  He's had pain while trying to have a bowel movement and he's trying to hold his stool.  He's had two BM since a week ago.  He has been using preparation H and was given a prescription for lidocaine ointment in the ER, but his pharmacy did not have it in stock until today.  Denies any purulent drainage.    He does report some constipation in the past after having back surgery.  Past Medical History: History reviewed. No pertinent past medical history.   Past Surgical History: Past Surgical History:  Procedure Laterality Date  . BACK SURGERY    . CARPAL TUNNEL RELEASE      Home Medications: Prior to Admission medications   Medication Sig Start Date End Date Taking? Authorizing Provider  Benzocaine 20 % OINT Apply 1 application topically every 6 (six) hours as needed (hemorrhoid). 06/17/20  Yes Sharyn Creamer, MD  DULoxetine (CYMBALTA) 30 MG capsule Take 30 mg by mouth daily.   Yes [provider]  fenofibrate 160 MG tablet Take 160 mg by mouth daily.   Yes [provider]  lisinopril (ZESTRIL) 10 MG tablet Take 10 mg by mouth daily.   Yes [provider]  QUEtiapine (SEROQUEL) 200 MG tablet Take 200 mg by mouth at bedtime.   Yes [provider]  hydrocortisone (ANUSOL-HC) 2.5 % rectal cream Place 1 application rectally 2 (two) times daily. 06/21/20   Aunika Kirsten, Elita Quick, MD  lidocaine (XYLOCAINE) 5 % ointment Apply 1  application topically as needed. 06/21/20   Henrene Dodge, MD    Allergies: No Known Allergies  Social History:  reports that he has quit smoking. He has never used smokeless tobacco. He reports current alcohol use of about 3.0 standard drinks of alcohol per week. He reports that he does not use drugs.   Family History: History reviewed. No pertinent family history.  Review of Systems: Review of Systems  Constitutional: Negative for chills and fever.  Respiratory: Negative for shortness of breath.   Cardiovascular: Negative for chest pain.  Gastrointestinal: Positive for constipation. Negative for abdominal pain.       Blood per rectum    Physical Exam BP (!) 160/95   Pulse 74   Temp 98 F (36.7 C) (Oral)   Ht 5\' 10"  (1.778 m)   Wt 251 lb (113.9 kg)   SpO2 96%   BMI 36.01 kg/m  CONSTITUTIONAL: No acute distress HEENT:  Normocephalic, atraumatic, extraocular motion intact. RESPIRATORY:  Normal respiratory effort without pathologic use of accessory muscles. CARDIOVASCULAR:  Regular rhythm and rate. RECTAL:  External exam reveals an enlarged and thrombosed external hemorrhoid of the right posterior column.  There is visible clot that has already ulcerated through the skin.  This was squeezed out, revealing an open wound, without further clot.  Digital rectal exam reveals normal hemorrhoidal columns internally. MUSCULOSKELETAL:  Normal muscle strength and tone in all four extremities.  No peripheral edema or cyanosis. SKIN: Skin turgor is normal. There are no pathologic skin lesions.  NEUROLOGIC:  Motor and sensation is grossly normal.  Cranial nerves are grossly intact. PSYCH:  Alert and oriented to person, place and time. Affect is normal.  Laboratory Analysis: No results found for this or any previous visit (from the past 24 hour(s)).  Imaging: No results found.  Assessment and Plan: This is a 49 y.o. male with a thrombosed external hemorrhoid.  --The skin overlying  the external hemorrhoid had already ruptured from the thrombosis and likely ulceration, so no I&D had to be done, and I only squeezed the clot out of the area.  No other enlarged or inflamed tissues.   --Explained to the patient what hemorrhoids are and how they can get clotted.  Discussed that constipation can be an important factor for development of inflamed or enlarged hemorrhoids and the differences between internal and external.  The clot today was evacuated without issues.  Discussed with him the regimen to follow going forwards, including Sitz baths twice daily or at least after bowel movements to help clean and soothe the tissue; avoiding constipation using stool softeners or MiraLax; starting Anusol ointment to help with the inflammation, and starting lidocaine ointment to help with pain control.   --He will follow up in two weeks to assess his progress and wound check.  Face-to-face time spent with the patient and care providers was 30 minutes, with more than 50% of the time spent counseling, educating, and coordinating care of the patient.     Howie Ill, MD [facility]

## 2020-06-21 NOTE — Patient Instructions (Addendum)
Please pick up your prescriptions at your pharmacy. You may add epsom salt to your sitz bath. Sitz bath twice daily or after bowel movement. Avoid constipation. Continue taking colace. Please see your appointment listed below.    Hemorrhoids Hemorrhoids are swollen veins that may develop:  In the butt (rectum). These are called internal hemorrhoids.  Around the opening of the butt (anus). These are called external hemorrhoids. Hemorrhoids can cause pain, itching, or bleeding. Most of the time, they do not cause serious problems. They usually get better with diet changes, lifestyle changes, and other home treatments. What are the causes? This condition may be caused by:  Having trouble pooping (constipation).  Pushing hard (straining) to poop.  Watery poop (diarrhea).  Pregnancy.  Being very overweight (obese).  Sitting for long periods of time.  Heavy lifting or other activity that causes you to strain.  Anal sex.  Riding a bike for a long period of time. What are the signs or symptoms? Symptoms of this condition include:  Pain.  Itching or soreness in the butt.  Bleeding from the butt.  Leaking poop.  Swelling in the area.  One or more lumps around the opening of your butt. How is this diagnosed? A doctor can often diagnose this condition by looking at the affected area. The doctor may also:  Do an exam that involves feeling the area with a gloved hand (digital rectal exam).  Examine the area inside your butt using a small tube (anoscope).  Order blood tests. This may be done if you have lost a lot of blood.  Have you get a test that involves looking inside the colon using a flexible tube with a camera on the end (sigmoidoscopy or colonoscopy). How is this treated? This condition can usually be treated at home. Your doctor may tell you to change what you eat, make lifestyle changes, or try home treatments. If these do not help, procedures can be done to remove  the hemorrhoids or make them smaller. These may involve:  Placing rubber bands at the base of the hemorrhoids to cut off their blood supply.  Injecting medicine into the hemorrhoids to shrink them.  Shining a type of light energy onto the hemorrhoids to cause them to fall off.  Doing surgery to remove the hemorrhoids or cut off their blood supply. Follow these instructions at home: Eating and drinking   Eat foods that have a lot of fiber in them. These include whole grains, beans, nuts, fruits, and vegetables.  Ask your doctor about taking products that have added fiber (fibersupplements).  Reduce the amount of fat in your diet. You can do this by: ? Eating low-fat dairy products. ? Eating less red meat. ? Avoiding processed foods.  Drink enough fluid to keep your pee (urine) pale yellow. Managing pain and swelling   Take a warm-water bath (sitz bath) for 20 minutes to ease pain. Do this 3-4 times a day. You may do this in a bathtub or using a portable sitz bath that fits over the toilet.  If told, put ice on the painful area. It may be helpful to use ice between your warm baths. ? Put ice in a plastic bag. ? Place a towel between your skin and the bag. ? Leave the ice on for 20 minutes, 2-3 times a day. General instructions  Take over-the-counter and prescription medicines only as told by your doctor. ? Medicated creams and medicines may be used as told.  Exercise often. Ask  your doctor how much and what kind of exercise is best for you.  Go to the bathroom when you have the urge to poop. Do not wait.  Avoid pushing too hard when you poop.  Keep your butt dry and clean. Use wet toilet paper or moist towelettes after pooping.  Do not sit on the toilet for a long time.  Keep all follow-up visits as told by your doctor. This is important. Contact a doctor if you:  Have pain and swelling that do not get better with treatment or medicine.  Have trouble  pooping.  Cannot poop.  Have pain or swelling outside the area of the hemorrhoids. Get help right away if you have:  Bleeding that will not stop. Summary  Hemorrhoids are swollen veins in the butt or around the opening of the butt.  They can cause pain, itching, or bleeding.  Eat foods that have a lot of fiber in them. These include whole grains, beans, nuts, fruits, and vegetables.  Take a warm-water bath (sitz bath) for 20 minutes to ease pain. Do this 3-4 times a day. This information is not intended to replace advice given to you by your health care provider. Make sure you discuss any questions you have with your health care provider. Document Revised: 07/30/2018 Document Reviewed: 12/11/2017 Elsevier Patient Education  2020 [employer].  How to Take a [employer] A sitz bath is a warm water bath that may be used to care for your rectum, genital area, or the area between your rectum and genitals (perineum). For a sitz bath, the water only comes up to your hips and covers your buttocks. A sitz bath may done at home in a bathtub or with a portable sitz bath that fits over the toilet. Your health care provider may recommend a sitz bath to help:  Relieve pain and discomfort after delivering a baby.  Relieve pain and itching from hemorrhoids or anal fissures.  Relieve pain after certain surgeries.  Relax muscles that are sore or tight. How to take a sitz bath Take 3-4 sitz baths a day, or as many as told by your health care provider. Bathtub sitz bath To take a sitz bath in a bathtub: 1. Partially fill a bathtub with warm water. The water should be deep enough to cover your hips and buttocks when you are sitting in the tub. 2. If your health care provider told you to put medicine in the water, follow his or her instructions. 3. Sit in the water. 4. Open the tub drain a little, and leave it open during your bath. 5. Turn on the warm water again, enough to replace the water that  is draining out. Keep the water running throughout your bath. This helps keep the water at the right level and the right temperature. 6. Soak in the water for 15-20 minutes, or as long as told by your health care provider. 7. When you are done, be careful when you stand up. You may feel dizzy. 8. After the sitz bath, pat yourself dry. Do not rub your skin to dry it.  Over-the-toilet sitz bath To take a sitz bath with an over-the-toilet basin: 1. Follow the manufacturer's instructions. 2. Fill the basin with warm water. 3. If your health care provider told you to put medicine in the water, follow his or her instructions. 4. Sit on the seat. Make sure the water covers your buttocks and perineum. 5. Soak in the water for 15-20 minutes, or as  long as told by your health care provider. 6. After the sitz bath, pat yourself dry. Do not rub your skin to dry it. 7. Clean and dry the basin between uses. 8. Discard the basin if it cracks, or according to the manufacturer's instructions. Contact a health care provider if:  Your symptoms get worse. Do not continue with sitz baths if your symptoms get worse.  You have new symptoms. If this happens, do not continue with sitz baths until you talk with your health care provider. Summary  A sitz bath is a warm water bath in which the water only comes up to your hips and covers your buttocks.  A sitz bath may help relieve itching, relieve pain, and relax muscles that are sore or tight in the lower part of your body, including your genital area.  Take 3-4 sitz baths a day, or as many as told by your health care provider. Soak in the water for 15-20 minutes.  Do not continue with sitz baths if your symptoms get worse. This information is not intended to replace advice given to you by your health care provider. Make sure you discuss any questions you have with your health care provider. Document Revised: 12/21/2018 Document Reviewed: 07/24/2017 Elsevier  Patient Education  2020 [employer].  Constipation, Adult Constipation is when a person:  Poops (has a bowel movement) fewer times in a week than normal.  Has a hard time pooping.  Has poop that is dry, hard, or bigger than normal. Follow these instructions at home: Eating and drinking   Eat foods that have a lot of fiber, such as: ? Fresh fruits and vegetables. ? Whole grains. ? Beans.  Eat less of foods that are high in fat, low in fiber, or overly processed, such as: ? [place] fries. ? Hamburgers. ? Cookies. ? Candy. ? Soda.  Drink enough fluid to keep your pee (urine) clear or pale yellow. General instructions  Exercise regularly or as told by your doctor.  Go to the restroom when you feel like you need to poop. Do not hold it in.  Take over-the-counter and prescription medicines only as told by your doctor. These include any fiber supplements.  Do pelvic floor retraining exercises, such as: ? Doing deep breathing while relaxing your lower belly (abdomen). ? Relaxing your pelvic floor while pooping.  Watch your condition for any changes.  Keep all follow-up visits as told by your doctor. This is important. Contact a doctor if:  You have pain that gets worse.  You have a fever.  You have not pooped for 4 days.  You throw up (vomit).  You are not hungry.  You lose weight.  You are bleeding from the anus.  You have thin, pencil-like poop (stool). Get help right away if:  You have a fever, and your symptoms suddenly get worse.  You leak poop or have blood in your poop.  Your belly feels hard or bigger than normal (is bloated).  You have very bad belly pain.  You feel dizzy or you faint. This information is not intended to replace advice given to you by your health care provider. Make sure you discuss any questions you have with your health care provider. Document Revised: 07/04/2017 Document Reviewed: 01/10/2016 Elsevier Patient Education   2020 [employer].

## 2020-07-05 ENCOUNTER — Ambulatory Visit: Payer: Medicare Other | Admitting: Surgery

## 2020-07-16 ENCOUNTER — Other Ambulatory Visit: Payer: Self-pay

## 2020-07-16 ENCOUNTER — Encounter: Payer: Self-pay | Admitting: Emergency Medicine

## 2020-07-16 ENCOUNTER — Ambulatory Visit
Admission: EM | Admit: 2020-07-16 | Discharge: 2020-07-16 | Disposition: A | Discharge status: Home / Self Care | Payer: Medicare Other | Attending: Emergency Medicine | Admitting: Emergency Medicine

## 2020-07-16 DIAGNOSIS — R220 Localized swelling, mass and lump, head: Secondary | ICD-10-CM

## 2020-07-16 MED ORDER — AMOXICILLIN-POT CLAVULANATE 875-125 MG PO TABS: 1.0000 | ORAL_TABLET | Freq: Two times a day (BID) | ORAL | 0 refills | Status: AC

## 2020-07-16 NOTE — ED Provider Notes (Signed)
[facility]    CSN: [id number] Arrival date & time: 07/16/20  0909      History   Chief Complaint Chief Complaint  Patient presents with  . Facial Swelling    HPI Aaron Murty. is a 49 y.o. male.   Aaron D Petion Jr. presents with complaints of right facial swelling he noted yesterday, which is worse today. No previous similar. Itching sensation to  Preauricular region. No fevers. No sore throat. No dental pain. No ear pain. No URI symptoms. Denies any previous similar. Hasn't taken any medications for symptoms.    ROS per HPI, negative if not otherwise mentioned.      History reviewed. No pertinent past medical history.  Patient Active Problem List   Diagnosis Date Noted  . Medicare annual wellness visit, initial 02/08/2020  . Obesity (BMI 35.0-39.9 without comorbidity) 08/31/2019  . OSA on CPAP 08/31/2019  . Postlaminectomy syndrome 08/30/2019  . Cervical spondylosis with radiculopathy 06/07/2019  . Other male erectile dysfunction 06/10/2018  . Mixed hyperlipidemia 06/25/2016  . Bipolar affective disorder in remission (HCC) 01/25/2016  . Essential hypertension 01/25/2016  . Vaccine counseling 01/25/2016    Past Surgical History:  Procedure Laterality Date  . BACK SURGERY    . CARPAL TUNNEL RELEASE         Home Medications    Prior to Admission medications   Medication Sig Start Date End Date Taking? Authorizing Provider  DULoxetine (CYMBALTA) 30 MG capsule Take 30 mg by mouth daily.   Yes [provider]  lisinopril (ZESTRIL) 10 MG tablet Take 10 mg by mouth daily.   Yes [provider]  QUEtiapine (SEROQUEL) 200 MG tablet Take 200 mg by mouth at bedtime.   Yes [provider]  amoxicillin-clavulanate (AUGMENTIN) 875-125 MG tablet Take 1 tablet by mouth every 12 (twelve) hours for 7 days. 07/16/20 07/23/20  Georgetta Haber, NP  Benzocaine 20 % OINT Apply 1 application topically every 6 (six) hours as needed  (hemorrhoid). 06/17/20   Sharyn Creamer, MD  fenofibrate 160 MG tablet Take 160 mg by mouth daily.    [provider]  hydrocortisone (ANUSOL-HC) 2.5 % rectal cream Place 1 application rectally 2 (two) times daily. 06/21/20   Piscoya, Elita Quick, MD  lidocaine (XYLOCAINE) 5 % ointment Apply 1 application topically as needed. 06/21/20   Henrene Dodge, MD    Family History History reviewed. No pertinent family history.  Social History Social History   Tobacco Use  . Smoking status: Former Games developer  . Smokeless tobacco: Never Used  Vaping Use  . Vaping Use: Never used  Substance Use Topics  . Alcohol use: Yes    Alcohol/week: 3.0 standard drinks    Types: 3 Cans of beer per week  . Drug use: No     Allergies   Patient has no known allergies.   Review of Systems Review of Systems   Physical Exam Triage Vital Signs ED Triage Vitals  Enc Vitals Group     BP 07/16/20 0937 135/84     Pulse Rate 07/16/20 0937 85     Resp 07/16/20 0937 16     Temp 07/16/20 0937 98.2 F (36.8 C)     Temp Source 07/16/20 0937 Oral     SpO2 07/16/20 0937 100 %     Weight 07/16/20 0934 250 lb (113.4 kg)     Height 07/16/20 0934 5\' 10"  (1.778 m)     Head Circumference --  Peak Flow --      Pain Score 07/16/20 0934 3     Pain Loc --      Pain Edu? --      Excl. in GC? --    No data found.  Updated Vital Signs BP 135/84 (BP Location: Right Arm)   Pulse 85   Temp 98.2 F (36.8 C) (Oral)   Resp 16   Ht 5\' 10"  (1.778 m)   Wt 250 lb (113.4 kg)   SpO2 100%   BMI 35.87 kg/m   Visual Acuity Right Eye Distance:   Left Eye Distance:   Bilateral Distance:    Right Eye Near:   Left Eye Near:    Bilateral Near:     Physical Exam Constitutional:      Appearance: He is well-developed.  HENT:     Head:     Jaw: No trismus.     Salivary Glands: Right salivary gland is diffusely enlarged.      Right Ear: Tympanic membrane and ear canal normal.     Left Ear: Tympanic membrane and  ear canal normal.     Mouth/Throat:     Mouth: Mucous membranes are moist.     Dentition: No dental tenderness, gingival swelling or dental abscesses.     Palate: No lesions.     Pharynx: Oropharynx is clear. No pharyngeal swelling or oropharyngeal exudate.     Comments: Right facial swelling which starts to preauricular face and extends inferiorly with firmness and some discomfort just below jaw, around tonsillar/ cervical lymph node region; no fluctuance; no sore throat or tonsillar findings on oral exam; no dental discomfort; no visible oral abscess or buccal swelling  Cardiovascular:     Rate and Rhythm: Normal rate.  Pulmonary:     Effort: Pulmonary effort is normal.  Skin:    General: Skin is warm and dry.  Neurological:     Mental Status: He is alert and oriented to person, place, and time.      UC Treatments / Results  Labs (all labs ordered are listed, but only abnormal results are displayed) Labs Reviewed - No data to display  EKG   Radiology No results found.  Procedures Procedures (including critical care time)  Medications Ordered in UC Medications - No data to display  Initial Impression / Assessment and Plan / UC Course  I have reviewed the triage vital signs and the nursing notes.  Pertinent labs & imaging results that were available during my care of the patient were reviewed by me and considered in my medical decision making (see chart for details).     Parotiditis considered although with the firmness which is just inferior to parotid tonsillar abscess also considered. Afebrile. No sore throat. augmentin provided, encouraged massage, sour candy, heat application with return precautions provided. Patient verbalized understanding and agreeable to plan.   Final Clinical Impressions(s) / UC Diagnoses   Final diagnoses:  Facial swelling     Discharge Instructions     I suspect this is related to a blockage of your parotid salivary gland.  Drink  plenty of water. Heat, and massage to the area.  Sucking on sour candy to promote salivation may help as well.  I am providing antibiotics to initiate as well. If no improvement in the next 2-3 days, or any worsening- increased pain or swelling, fevers, please return.     ED Prescriptions    Medication Sig Dispense Auth. Provider   amoxicillin-clavulanate (AUGMENTIN) 875-125  MG tablet Take 1 tablet by mouth every 12 (twelve) hours for 7 days. 14 tablet Georgetta Haber, NP     PDMP not reviewed this encounter.   Georgetta Haber, NP 07/16/20 1029

## 2020-07-16 NOTE — ED Triage Notes (Signed)
Patient c/o pain and swelling on the right side of his facial cheek that started yesterday.  Patient reports itching behind his right ear.  Patient denies fever.

## 2020-07-16 NOTE — Discharge Instructions (Signed)
I suspect this is related to a blockage of your parotid salivary gland.  Drink plenty of water. Heat, and massage to the area.  Sucking on sour candy to promote salivation may help as well.  I am providing antibiotics to initiate as well. If no improvement in the next 2-3 days, or any worsening- increased pain or swelling, fevers, please return.

## 2020-12-30 IMAGING — CT CT L SPINE W/O CM
3 of 5 series · 11 of 33 positions shown, 13 images · non-contrast
Comparison: Lumbar MRI from 7 days ago

CLINICAL DATA: Severe right lower back pain radiating to the leg.

EXAM:
CT LUMBAR SPINE WITHOUT CONTRAST
TECHNIQUE: Multidetector CT imaging of the lumbar spine was performed without
intravenous contrast administration. Multiplanar CT image
reconstructions were also generated.

[Series 2: lspine st l-spine 2.00 · axial · 0.33mm/px · z∈[-1675,-1535]mm · 3 of 141 slices shown, 4 images]
[im 36/141  soft-tissue]
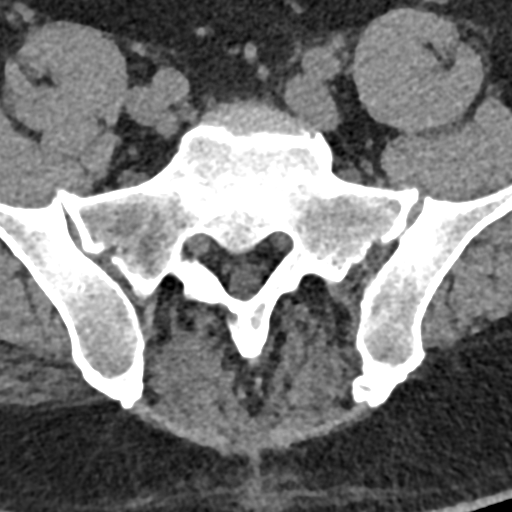
[im 36/141  bone]
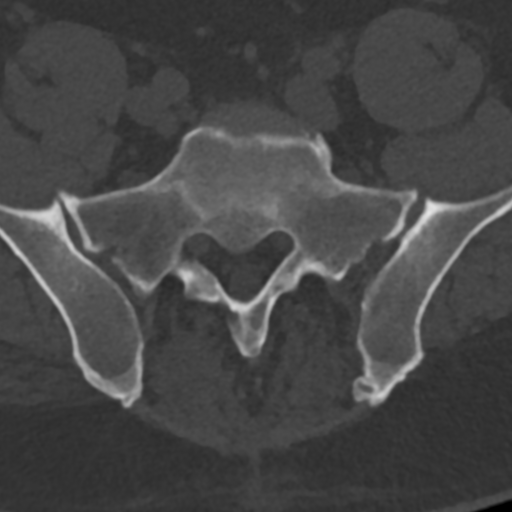
[im 71/141  bone]
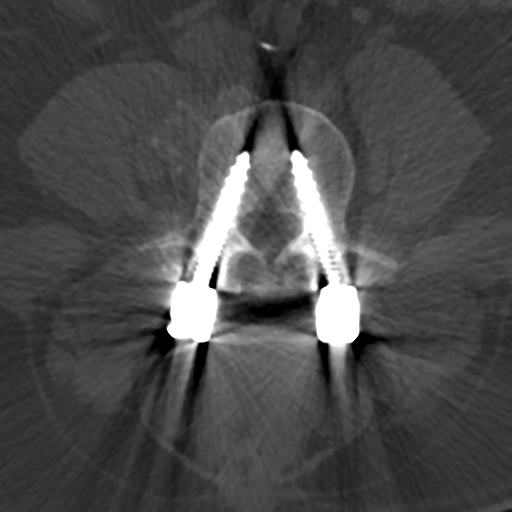
[im 106/141  bone]
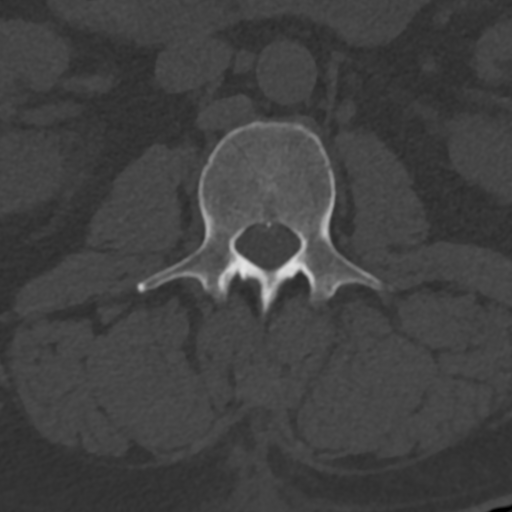

[Series 4: sag bone l-spine 2.00 sag · sagittal · 0.33mm/px · 5 of 82 slices shown, 6 images]
[im 28/82  bone]
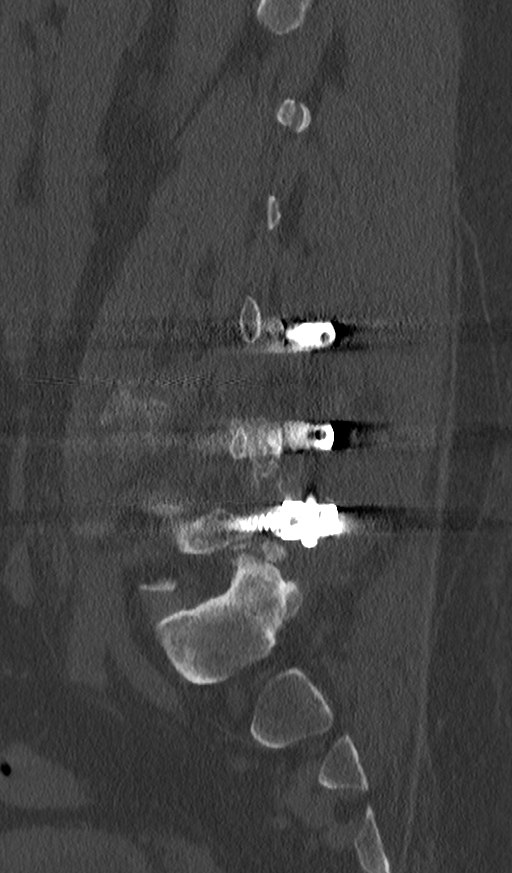
[im 34/82  bone]
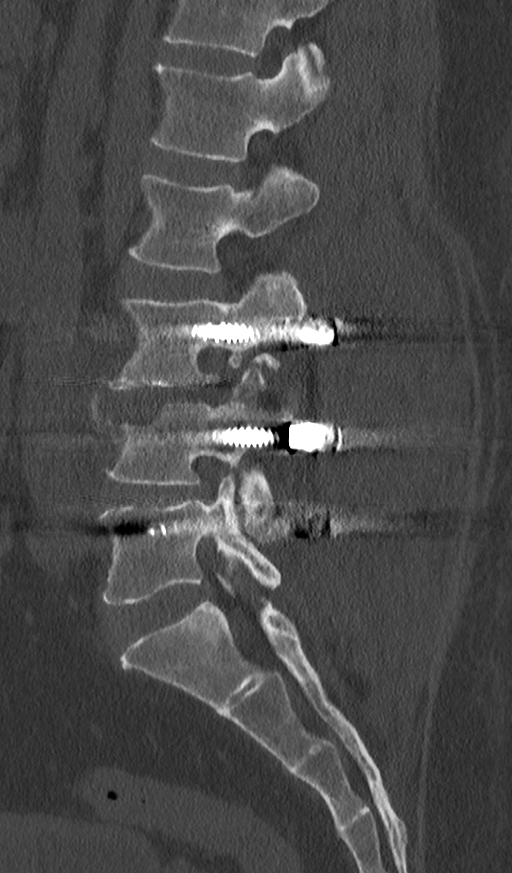
[im 41/82  soft-tissue]
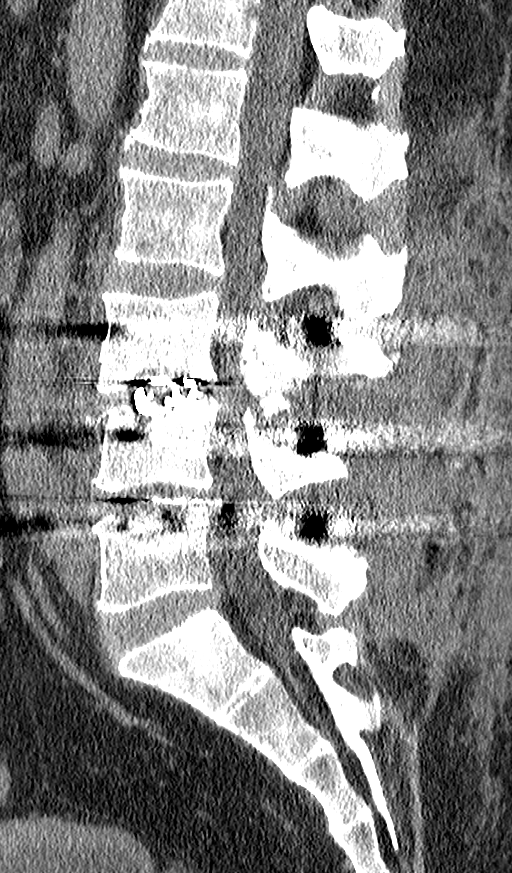
[im 41/82  bone]
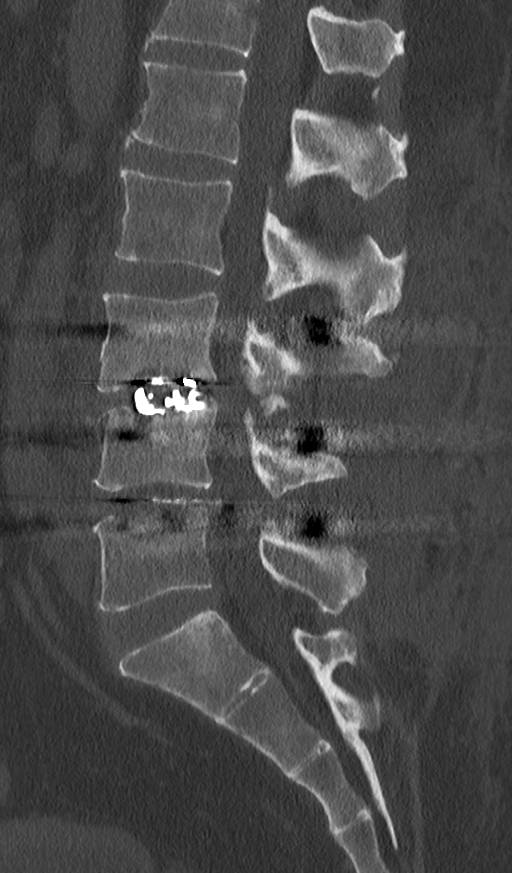
[im 48/82  bone]
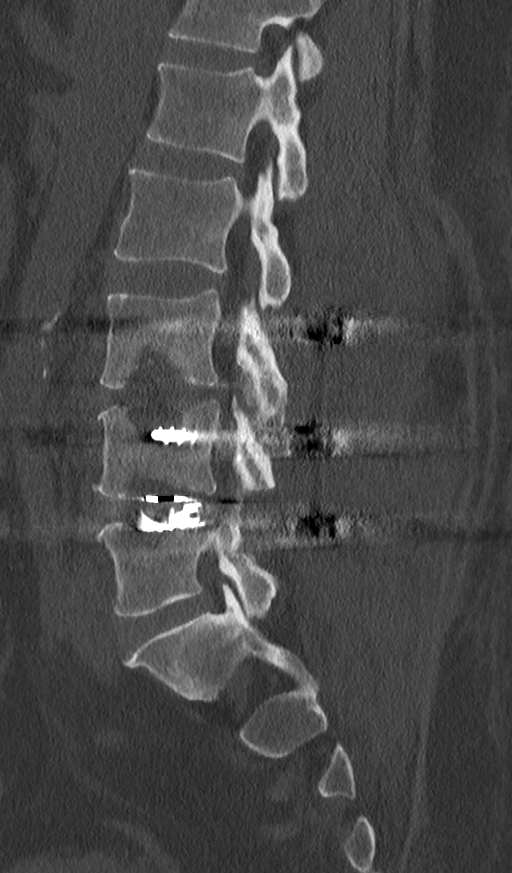
[im 55/82  bone]
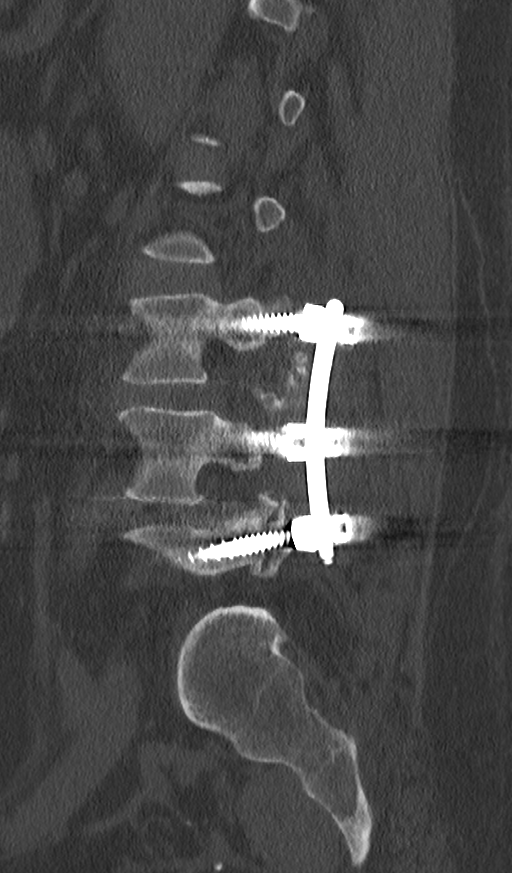

[Series 6: cor bone l-spine 2.00 cor · coronal · 0.33mm/px · 3 of 80 slices shown]
[im 16/80  bone]
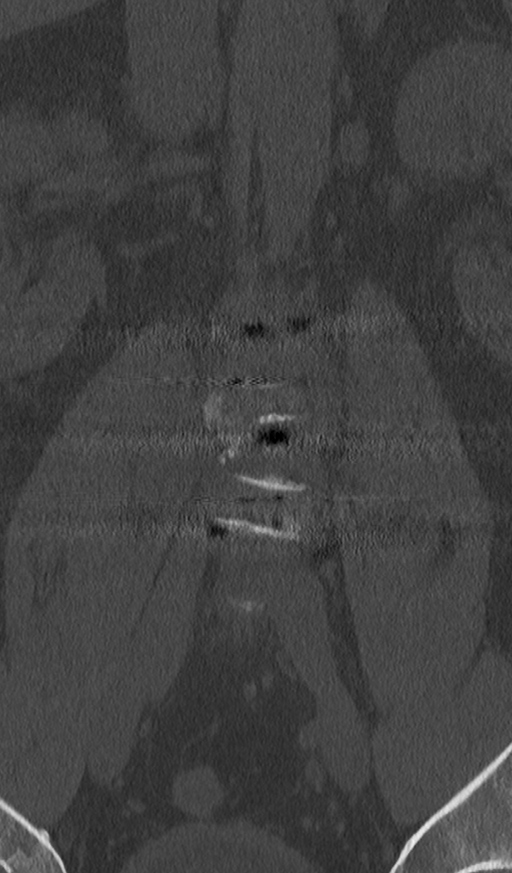
[im 32/80  bone]
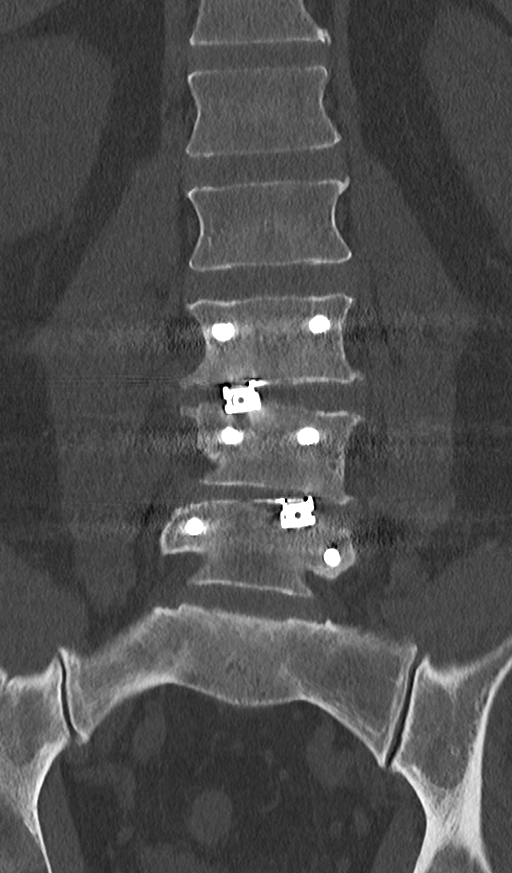
[im 48/80  bone]
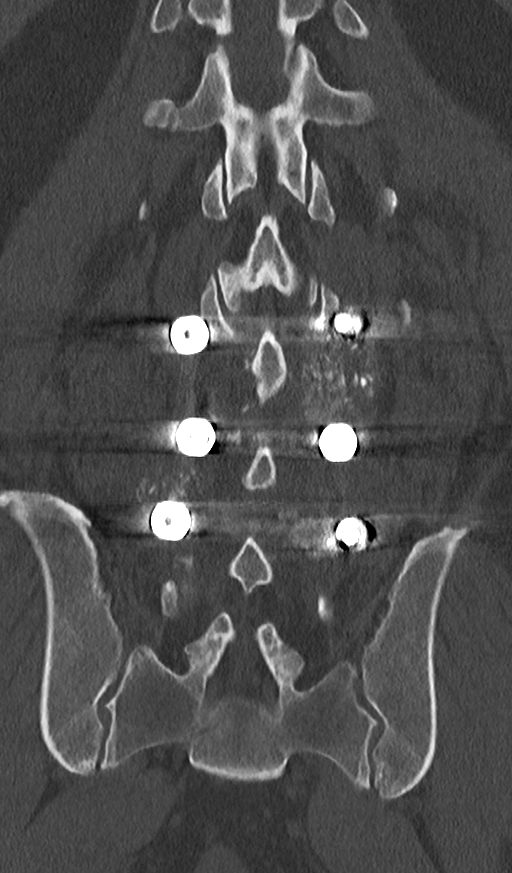

[11 of 33 positions shown; findings below may reference images not displayed]

FINDINGS: Segmentation: 5 lumbar type vertebrae

Alignment: Physiologic

Vertebrae: L3-4 and L4-5 PLIF. Prominent lucency around both grafts,
which remain located. Periosteal reaction is seen anterior to the L4
body. Overall findings remain concerning for disc space infection.
If this were all from motion would expect screw loosening is well,
which is not seen. No visible incorporation of posterolateral bone
graft.

Paraspinal and other soft tissues:

Paraspinal fat edema at the operative levels, better seen on prior
MRI. No visible collection.

Disc levels: The canal and foramina or better assessed on recent
lumbar MRI. No visible impingement.
IMPRESSION: 1. Endplate erosion around both L3-4 and L4-5 interbody grafts with
periosteal reaction at L4 and paraspinal edema, there is continued
imaging concern for infection.
2. Rod and pedicle screws remain in place.

## 2022-06-04 ENCOUNTER — Ambulatory Visit (INDEPENDENT_AMBULATORY_CARE_PROVIDER_SITE_OTHER): Payer: Medicare Other | Admitting: Psychiatry

## 2022-06-04 ENCOUNTER — Encounter: Payer: Self-pay | Admitting: Psychiatry

## 2022-06-04 ENCOUNTER — Ambulatory Visit
Admission: RE | Admit: 2022-06-04 | Discharge: 2022-06-04 | Disposition: A | Discharge status: Home / Self Care | Payer: Medicare Other | Source: Ambulatory Visit | Attending: Family Medicine | Admitting: Family Medicine

## 2022-06-04 VITALS — BP 174/105 | HR 67 | Temp 98.8°F | Ht 70.0 in | Wt 262.0 lb

## 2022-06-04 DIAGNOSIS — F32A Depression, unspecified: Secondary | ICD-10-CM | POA: Insufficient documentation

## 2022-06-04 DIAGNOSIS — F1290 Cannabis use, unspecified, uncomplicated: Secondary | ICD-10-CM

## 2022-06-04 DIAGNOSIS — F1910 Other psychoactive substance abuse, uncomplicated: Secondary | ICD-10-CM

## 2022-06-04 DIAGNOSIS — F411 Generalized anxiety disorder: Secondary | ICD-10-CM | POA: Diagnosis not present

## 2022-06-04 DIAGNOSIS — Z79899 Other long term (current) drug therapy: Secondary | ICD-10-CM

## 2022-06-04 DIAGNOSIS — E66812 Obesity, class 2: Secondary | ICD-10-CM | POA: Insufficient documentation

## 2022-06-04 DIAGNOSIS — Z9189 Other specified personal risk factors, not elsewhere classified: Secondary | ICD-10-CM | POA: Diagnosis not present

## 2022-06-04 DIAGNOSIS — F1211 Cannabis abuse, in remission: Secondary | ICD-10-CM | POA: Insufficient documentation

## 2022-06-04 MED ORDER — DULOXETINE HCL 20 MG PO CPEP: 20.0000 mg | ORAL_CAPSULE | Freq: Every day | ORAL | 0 refills | Status: DC

## 2022-06-04 MED ORDER — DULOXETINE HCL 30 MG PO CPEP: 30.0000 mg | ORAL_CAPSULE | Freq: Every day | ORAL | 0 refills | Status: DC

## 2022-06-04 NOTE — Patient Instructions (Signed)
Please call for [phone] 

## 2022-06-04 NOTE — Progress Notes (Unsigned)
Psychiatric Initial Adult Assessment   Patient Identification: Aaron Crosby. MRN:  [id number] Date of Evaluation:  06/04/2022 Referral Source: Marisue Ivan MD Chief Complaint:   Chief Complaint  Patient presents with   Establish Care   Anxiety   Depression   Visit Diagnosis:    ICD-10-CM   1. Depressive disorder  F32.A TSH    Prolactin    Urine drugs of abuse scrn w alc, routine (Ref Lab)    EKG 12-Lead    DULoxetine (CYMBALTA) 30 MG capsule    DULoxetine (CYMBALTA) 20 MG capsule   unspecified, R/O Bipolar disorder    2. GAD (generalized anxiety disorder)  F41.1 TSH    DULoxetine (CYMBALTA) 30 MG capsule    DULoxetine (CYMBALTA) 20 MG capsule    3. Cannabis use disorder  F12.90 TSH   mild     4. Polysubstance abuse (HCC)  F19.10 TSH    Prolactin    Urine drugs of abuse scrn w alc, routine (Ref Lab)   in remission , cocaine, alcohol, hallucinogen    5. At risk for prolonged QT interval syndrome  Z91.89 EKG 12-Lead    6. High risk medication use  Z79.899 TSH    Urine drugs of abuse scrn w alc, routine (Ref Lab)    EKG 12-Lead      History of Present Illness:  Aaron Crosbyis a 51 year old Caucasian male, on disability, lives in [place], married, has a history of bipolar disorder ( per report), anxiety disorder, polysubstance abuse per report, hypertension, hyperlipidemia, obesity, chronic back pain was evaluated in office today, presented to establish care.  Patient reports he was under the care of [facility] previously.  However his outpatient provider closed down and hence his primary care provider was managing his medications for the past several months.  Patient reports he may have been diagnosed with bipolar disorder.  He reports a history of having highs or manic symptoms in the past when he would get into fights, was argumentative, irritable, high energy for a few days, decreased need for sleep and racing thoughts.  Patient reports he  may have gotten into trouble in the past due to his manic symptoms, legal trouble, however currently has none pending.  Patient also reports episodes of depression when he had significant sadness, low motivation, low energy and sown.  He currently believes he is likely having depressive symptoms.  He describes he has symptoms of sadness, anxiety, irritability since the past several weeks.  Reports his current situation at home likely contributing to this.  Patient reports his daughter is currently struggling with mental health problems and is unemployed which likely is contributing to his current mood symptoms.  Patient is currently compliant on the duloxetine 30 mg which he reports he has been taking for the past several years.  No recent dose changes.  He also currently taking Seroquel which he has been taking since the past several years.  Denies side effects to these medications.  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  Currently symptoms are worsening since the past several weeks.  Patient reports current medications not beneficial at this dosage.  Reports he is currently not in psychotherapy.  Patient reports sleep also as restless since the past few nights.  He has chronic back pain which likely also contributing to his sleep problems.  Does  have Seroquel which he takes at night which helps to some extent.  Patient denies any suicidality, homicidality.  Does report a history of auditory hallucinations, several years ago does not elaborate.  Currently denies it.  Patient denies any significant history of trauma.  Patient does report a history of significant substance abuse, polysubstance abuse in the past as noted below in substance abuse history.  Currently does report using cannabis 2-3 times a week, does not elaborate on how much.  However reports he does not  use much anymore.  First use was when he was 51 years old.  He used to abuse it heavily in the past.     Associated Signs/Symptoms: Depression Symptoms:  depressed mood, insomnia, anxiety, (Hypo) Manic Symptoms:  Irritable Mood, Labiality of Mood, Anxiety Symptoms:  Excessive Worry, Psychotic Symptoms:   Denies PTSD Symptoms: Negative  Past Psychiatric History: Patient reports 1 hospital admission-at [facility] at the age of 89 after an attempted suicide by hanging.  Patient denies any other inpatient behavioral health admissions or suicide attempts after that.  He was under the care of [facility] most recently, until they closed down.  Most recently medications were being prescribed by primary care provider.  Previous Psychotropic Medications: Yes multiple medication trials in the past-does not remember all the names.  Substance Abuse History in the last 12 months:  Yes.  Cannabis-first use at the age of 31, heavy abuse in the past, most recently using couple of times a week, a limited amount helps him to sleep.  Patient also reports use of cocaine, alcohol, hallucinogens-heavy abuse in the past however quit using a long time ago.  Consequences of Substance Abuse: Unknown if mood symptoms related to cannabis use or hx of substance abuse in the past  Past Medical History:  Past Medical History:  Diagnosis Date   Anxiety    Depression     Past Surgical History:  Procedure Laterality Date   BACK SURGERY     CARPAL TUNNEL RELEASE      Family Psychiatric History: As noted below.  Family History:  Family History  Problem Relation Age of Onset   Dementia Mother    Suicidality Cousin    Autism spectrum disorder Grandson    Drug abuse Daughter     Social History:   Social History   Socioeconomic History   Marital status: Married    Spouse name: Not on file   Number of children: 2   Years of education: Not on file   Highest education  level: 8th grade  Occupational History   Not on file  Tobacco Use   Smoking status: Former   Smokeless tobacco: Never  Vaping Use   Vaping Use: Never used  Substance and Sexual Activity   Alcohol use: Yes    Alcohol/week: 6.0 standard drinks of alcohol    Types: 6 Cans of beer per week   Drug use: Yes    Types: Marijuana   Sexual activity: Yes    Birth control/protection: None  Other Topics Concern   Not on file  Social History Narrative   Not on file   Social Determinants of Health   Financial Resource Strain: Not on file  Food Insecurity: Not on file  Transportation Needs: Not on file  Physical Activity: Not on file  Stress: Not on file  Social Connections: Not on file    Additional Social History: Patient was born and raised in [place] by both parents.  Patient went up to eighth grade.  4 siblings, 1 brother passed away, 3 siblings alive at this time.  Currently on disability.  Patient is married.  Has 2 daughters, one of his daughter is doing very well, has 2 grandchildren from her.  The other daughter currently struggling with mental health and has history of substance abuse-patient reports this daughter as a stressor for him at this time.  He has 2 grandchildren from this daughter also.  Patient currently lives in [place].  Does report a history of legal problems in the past, currently none pending.  Denies history of trauma.  Allergies:  No Known Allergies  Metabolic Disorder Labs: No results found for: "HGBA1C", "MPG" No results found for: "PROLACTIN" No results found for: "CHOL", "TRIG", "HDL", "CHOLHDL", "VLDL", "LDLCALC" No results found for: "TSH"  Therapeutic Level Labs: No results found for: "LITHIUM" No results found for: "CBMZ" No results found for: "VALPROATE"  Current Medications: Current Outpatient Medications  Medication Sig Dispense Refill   DULoxetine (CYMBALTA) 20 MG capsule Take 1 capsule (20 mg total) by mouth daily. Take along with  Duloxetine 30 mg daily 90 capsule 0   fenofibrate 160 MG tablet Take 160 mg by mouth daily.     fluticasone (FLONASE) 50 MCG/ACT nasal spray Place into the nose.     lisinopril (ZESTRIL) 40 MG tablet Take 40 mg by mouth daily.     omeprazole (PRILOSEC) 40 MG capsule Take 40 mg by mouth daily.     QUEtiapine (SEROQUEL) 200 MG tablet Take 200 mg by mouth at bedtime.     tadalafil (CIALIS) 10 MG tablet Take 10 mg by mouth daily as needed for erectile dysfunction.     DULoxetine (CYMBALTA) 30 MG capsule Take 1 capsule (30 mg total) by mouth daily. Take along with Duloxetine 20 mg daily 90 capsule 0   No current facility-administered medications for this visit.    Musculoskeletal: Strength & Muscle Tone: within normal limits Gait & Station: normal Patient leans: N/A  Psychiatric Specialty Exam: Review of Systems  Musculoskeletal:  Positive for back pain (chronic).  Psychiatric/Behavioral:  Positive for dysphoric mood and sleep disturbance. The patient is nervous/anxious.   All other systems reviewed and are negative.   Blood pressure (S) (!) 174/105, pulse 67, temperature 98.8 F (37.1 C), temperature source Oral, height 5\' 10"  (1.778 m), weight 262 lb (118.8 kg).Body mass index is 37.59 kg/m.  General Appearance: Casual  Eye Contact:  Fair  Speech:  Normal Rate  Volume:  Normal  Mood:  Anxious, Depressed, and Irritable  Affect:  Appropriate  Thought Process:  Goal Directed and Descriptions of Associations: Intact  Orientation:  Full (Time, Place, and Person)  Thought Content:  Logical  Suicidal Thoughts:  No  Homicidal Thoughts:  No  Memory:  Immediate;   Fair Recent;   Fair Remote;   Poor  Judgement:  Fair  Insight:  Shallow  Psychomotor Activity:  Normal  Concentration:  Concentration: Fair and Attention Span: Fair  Recall:  [place]: Fair  Akathisia:  No  Handed:  Right  AIMS (if indicated):  not done  Assets:  Communication  Skills Desire for Improvement Housing Social Support Transportation  ADL's:  Intact  Cognition: WNL  Sleep:   restless at times   Screenings: [place] from 06/04/2022 in [place]  Total GAD-7 Score 10      PHQ2-9    Flowsheet  Row Office Visit from 06/04/2022 in [facility]  PHQ-2 Total Score 2  PHQ-9 Total Score 10      Flowsheet Row Office Visit from 06/04/2022 in [facility]  C-SSRS RISK CATEGORY Low Risk      GAD-7    Flowsheet Row Office Visit from 06/04/2022 in [facility]  Total GAD-7 Score 10      PHQ2-9    Flowsheet Row Office Visit from 06/04/2022 in [facility]  PHQ-2 Total Score 2  PHQ-9 Total Score 10      Flowsheet Row Office Visit from 06/04/2022 in [facility]  C-SSRS RISK CATEGORY Low Risk        Assessment and Plan: Patryk Conant. Is a 51 year old Caucasian male, on disability, lives in [place], married, has a history of self-reported bipolar disorder, anxiety disorder, other medical problems, currently struggling with situational stressors which contributes to worsening mood symptoms, anxiety, will benefit from the following plan.  Patient also with substance abuse problem-currently uses cannabis, mild use, will benefit from the following plan. The patient demonstrates the following risk factors for suicide: Chronic risk factors for suicide include: psychiatric disorder of bipolar disorder, anxiety, substance use disorder, chronic pain, and completed suicide in a family member. Acute risk factors for suicide include:  uncontrolled anxiety, depression . Protective factors for this patient include: positive social support, positive therapeutic relationship, coping skills, and hope for the future. Considering these factors, the overall suicide  risk at this point appears to be low. Patient is appropriate for outpatient follow up.  Plan  Depression unspecified-rule out bipolar disorder-unstable Increase Cymbalta to 50 mg p.o. daily Continue Seroquel 200 mg p.o. nightly Referral for CBT Completed a mood disorder questionnaire and scored high on the same.  However will continue to explore this in future sessions.  Generalized anxiety disorder-unstable Increase Cymbalta to 50 mg p.o. daily Referral for CBT I have communicated with staff to schedule this patient with our new therapist, patient to be placed on a wait list.  Cannabis use disorder mild-unstable Patient currently uses cannabis, limited use although he does have a history of heavy abuse in the past. Provided counseling. Will get urine drug screen.  Patient provided lab slip.  Polysubstance abuse-history of cocaine, LSD, alcohol abuse in the past-in remission Will monitor closely.  At risk for prolonged QT syndrome-we will order EKG-patient to call [phone].  High risk medication use-I have reviewed labs in the system including CBC with differential, CMP, hemoglobin A1c- Will order TSH, urine drug screen-patient provided lab slip.  Unable to get records from [facility] since they shut down. I have reviewed notes per Dr. Linthavong-05/23/2022-patient was continued on Seroquel and Cymbalta.  Patient with high blood pressure reading in session today, advised to follow up with primary care provider.  Patient with elevated blood pressure reading in session today blood pressure was repeated continued to be high, patient advised to seek help, contact primary care provider or go to the nearest urgent care.  Follow-up in clinic in 4 weeks or sooner if needed.  This note was generated in part or whole with voice recognition software. Voice recognition is usually quite accurate but there are transcription errors that can and very often do occur. I apologize  for any typographical errors that were not detected and corrected.     Jomarie Longs, MD 11/1/20231:28 PM

## 2022-06-17 ENCOUNTER — Ambulatory Visit: Payer: Self-pay | Admitting: Psychiatry

## 2022-07-01 ENCOUNTER — Ambulatory Visit (INDEPENDENT_AMBULATORY_CARE_PROVIDER_SITE_OTHER): Payer: Medicare Other | Admitting: Psychiatry

## 2022-07-01 ENCOUNTER — Telehealth: Payer: Self-pay

## 2022-07-01 ENCOUNTER — Encounter: Payer: Self-pay | Admitting: Psychiatry

## 2022-07-01 VITALS — BP 168/100 | HR 77 | Temp 98.0°F | Ht 70.0 in | Wt 260.0 lb

## 2022-07-01 DIAGNOSIS — Z79899 Other long term (current) drug therapy: Secondary | ICD-10-CM

## 2022-07-01 DIAGNOSIS — F32A Depression, unspecified: Secondary | ICD-10-CM

## 2022-07-01 DIAGNOSIS — F411 Generalized anxiety disorder: Secondary | ICD-10-CM | POA: Diagnosis not present

## 2022-07-01 DIAGNOSIS — F1211 Cannabis abuse, in remission: Secondary | ICD-10-CM | POA: Diagnosis not present

## 2022-07-01 DIAGNOSIS — Z9189 Other specified personal risk factors, not elsewhere classified: Secondary | ICD-10-CM

## 2022-07-01 DIAGNOSIS — F1910 Other psychoactive substance abuse, uncomplicated: Secondary | ICD-10-CM

## 2022-07-01 NOTE — Progress Notes (Unsigned)
BH MD OP Progress Note  07/01/2022 11:07 AM Aaron D Badal Jr.  MRN:  [id number]  Chief Complaint:  Chief Complaint  Patient presents with   Follow-up   Medication Refill   HPI: Aaron Castagna. is a 51 year old Caucasian male on disability, lives in [place], married, has a history of bipolar disorder ( per report ), anxiety disorder, polysubstance abuse, hypertension, hyperlipidemia, obesity, chronic back pain was evaluated in office today.  Patient today reports he continues to have anxiety symptoms although he does not feel that depressed.  Patient reports he has a lot of psychosocial stressors going on at home which likely triggering his anxiety.  Patient reports he has been compliant on the Cymbalta.  However he has been feeling jittery or shaky on and off on the Cymbalta higher dosage.  Patient reports sleep continues to be restless mostly because of his old mattress as well as back pain.  Continues to be compliant on Seroquel.  Denies side effects.  Patient denies any suicidality, homicidality or perceptual disturbances.  Reports he has not used cannabis in the past 3 weeks however it has been hard for him and would like to go back.  Patient wonders why cannabis is not good for him.  Patient denies any other concerns today.  Visit Diagnosis:    ICD-10-CM   1. Depressive disorder  F32.A    R/O Bipolar disorder    2. GAD (generalized anxiety disorder)  F41.1 DULoxetine (CYMBALTA) 30 MG capsule    3. Cannabis use disorder, mild, in early remission  F12.11     4. Polysubstance abuse (HCC)  F19.10    in remission- cocaine, alcohol, hallucinogen    5. At risk for prolonged QT interval syndrome  Z91.89     6. High risk medication use  Z79.899       Past Psychiatric History: Reviewed past psychiatric history from progress note on 06/04/2022.  Past Medical History:  Past Medical History:  Diagnosis Date   Anxiety    Depression     Past Surgical History:  Procedure  Laterality Date   BACK SURGERY     CARPAL TUNNEL RELEASE      Family Psychiatric History: Reviewed family psychiatric history from progress note on 06/04/2022.  Family History:  Family History  Problem Relation Age of Onset   Dementia Mother    Suicidality Cousin    Autism spectrum disorder Grandson    Drug abuse Daughter     Social History: Reviewed social history from progress note on 06/04/2022. Social History   Socioeconomic History   Marital status: Married    Spouse name: Not on file   Number of children: 2   Years of education: Not on file   Highest education level: 8th grade  Occupational History   Not on file  Tobacco Use   Smoking status: Former   Smokeless tobacco: Never  Vaping Use   Vaping Use: Never used  Substance and Sexual Activity   Alcohol use: Yes    Alcohol/week: 6.0 standard drinks of alcohol    Types: 6 Cans of beer per week   Drug use: Yes    Types: Marijuana   Sexual activity: Yes    Birth control/protection: None  Other Topics Concern   Not on file  Social History Narrative   Not on file   Social Determinants of Health   Financial Resource Strain: Not on file  Food Insecurity: Not on file  Transportation Needs: Not on file  Physical Activity: Not on file  Stress: Not on file  Social Connections: Not on file    Allergies: No Known Allergies  Metabolic Disorder Labs: No results found for: "HGBA1C", "MPG" No results found for: "PROLACTIN" No results found for: "CHOL", "TRIG", "HDL", "CHOLHDL", "VLDL", "LDLCALC" No results found for: "TSH"  Therapeutic Level Labs: No results found for: "LITHIUM" No results found for: "VALPROATE" No results found for: "CBMZ"  Current Medications: Current Outpatient Medications  Medication Sig Dispense Refill   fenofibrate 160 MG tablet Take 160 mg by mouth daily.     fluticasone (FLONASE) 50 MCG/ACT nasal spray Place into the nose.     lisinopril (ZESTRIL) 40 MG tablet Take 40 mg by mouth  daily.     omeprazole (PRILOSEC) 40 MG capsule Take 40 mg by mouth daily.     QUEtiapine (SEROQUEL) 200 MG tablet Take 200 mg by mouth at bedtime.     tadalafil (CIALIS) 10 MG tablet Take 10 mg by mouth daily as needed for erectile dysfunction.     DULoxetine (CYMBALTA) 30 MG capsule Take 1 capsule (30 mg total) by mouth daily. Dose change 90 capsule 0   No current facility-administered medications for this visit.     Musculoskeletal: Strength & Muscle Tone: within normal limits Gait & Station: normal Patient leans: N/A  Psychiatric Specialty Exam: Review of Systems  Musculoskeletal:  Positive for back pain.  Psychiatric/Behavioral:  The patient is nervous/anxious.   All other systems reviewed and are negative.   Blood pressure (!) 168/100, pulse 77, temperature 98 F (36.7 C), temperature source Oral, height 5\' 10"  (1.778 m), weight 260 lb (117.9 kg).Body mass index is 37.31 kg/m.  General Appearance: Casual  Eye Contact:  Fair  Speech:  Clear and Coherent  Volume:  Normal  Mood:  Anxious  Affect:  Congruent  Thought Process:  Goal Directed and Descriptions of Associations: Intact  Orientation:  Full (Time, Place, and Person)  Thought Content: Logical   Suicidal Thoughts:  No  Homicidal Thoughts:  No  Memory:  Immediate;   Fair Recent;   Fair Remote;   Fair  Judgement:  Fair  Insight:  Fair  Psychomotor Activity:  Normal  Concentration:  Concentration: Fair and Attention Span: Fair  Recall:  [employer] of Knowledge: Fair  Language: Fair  Akathisia:  No  Handed:  Left  AIMS (if indicated): done  Assets:  Communication Skills Desire for Improvement Housing Intimacy Social Support  ADL's:  Intact  Cognition: WNL  Sleep:   restless   Screenings: GAD-7    Physiological scientist Office Visit from 07/01/2022 in [place] Office Visit from 06/04/2022 in [place]  Total GAD-7 Score 3 10      PHQ2-9     [place] Visit from 07/01/2022 in [place] Visit from 06/04/2022 in [place]  PHQ-2 Total Score 0 2  PHQ-9 Total Score 4 10      [address] Visit from 07/01/2022 in [place] Visit from 06/04/2022 in [place] No Risk Low Risk        Assessment and Plan: Aaron Podgorny. Is a 51 year old Caucasian male on disability, lives in [place], married, has a history of self-reported bipolar disorder, anxiety disorder, elevated blood pressure and multiple other medical problems was evaluated in office today.  Patient is currently struggling with anxiety although depression symptoms  are improving.  Patient also with sleep problems, will benefit from following plan.  Plan Depression unspecified-rule out bipolar disorder-improving Reduce Cymbalta to 30 mg p.o. daily, patient's previous dosage due to side effects of jitteriness as well as elevated blood pressure reading. Seroquel 200 mg p.o. nightly Patient referred for CBT-has upcoming appointment.  Generalized anxiety disorder-unstable Cymbalta as prescribed Referral for CBT has upcoming appointment.  Cannabis use disorder mild in early remission Urine drug screen-reviewed and discussed with patient-positive for cannabis-dated 06/04/2022. Provided counseling again  Polysubstance abuse-history of cocaine, LSD, alcohol abuse-in remission Will monitor closely  At risk for prolonged QT syndrome-normal sinus rhythm, QTc-410, EKG dated 06/04/2022-discussed with patient.  High risk medication use-urine drug screen positive for cannabis-dated 06/04/2022-discussed with patient, provided counseling again.  TSH-within normal limits.  Patient will benefit from a prolactin level, hemoglobin A1c if not already completed.  We will order in the future as needed.  Patient  with elevated blood pressure reading in session-advised to follow up with primary care provider as soon as possible.  Follow-up in clinic in 3 months or sooner if needed.  Patient would like to pursue psychotherapy first prior to making more medication changes.   This note was generated in part or whole with voice recognition software. Voice recognition is usually quite accurate but there are transcription errors that can and very often do occur. I apologize for any typographical errors that were not detected and corrected.    Ursula Alert, MD 07/02/2022, 8:24 AM

## 2022-07-01 NOTE — Telephone Encounter (Signed)
faxing over patient BP readings to PCP

## 2022-07-01 NOTE — Telephone Encounter (Signed)
Noted  

## 2022-07-02 MED ORDER — DULOXETINE HCL 30 MG PO CPEP: 30.0000 mg | ORAL_CAPSULE | Freq: Every day | ORAL | 0 refills | Status: DC

## 2022-07-02 NOTE — Addendum Note (Signed)
Addended byJomarie Longs on: 07/02/2022 08:24 AM   Modules accepted: Orders

## 2022-07-05 ENCOUNTER — Encounter: Payer: Self-pay | Admitting: Licensed Clinical Social Worker

## 2022-07-05 ENCOUNTER — Ambulatory Visit (INDEPENDENT_AMBULATORY_CARE_PROVIDER_SITE_OTHER): Payer: Medicare Other | Admitting: Licensed Clinical Social Worker

## 2022-07-05 DIAGNOSIS — F32A Depression, unspecified: Secondary | ICD-10-CM | POA: Diagnosis not present

## 2022-07-05 DIAGNOSIS — F411 Generalized anxiety disorder: Secondary | ICD-10-CM

## 2022-07-05 NOTE — Progress Notes (Signed)
Comprehensive Clinical Assessment (CCA) Note  07/05/2022 Aaron Crosby. [id number]  10am-11am  Pt presented in person at [facility] office. Pt and LCSW were present during the visit.    Chief Complaint:  Chief Complaint  Patient presents with   Depression   Anxiety   Panic Attack    Pt stated that he has had panic attacks in the past and stated that he had one about two months    Visit Diagnosis:  Encounter Diagnoses  Name Primary?   GAD (generalized anxiety disorder) Yes   Depressive disorder      Pt is a 51 year old married caucasian male who lives with his wife. Pt presented with symptoms of anxiety and depression. Pt stated that he has been having external stressors that are causing him to feel anxious and depressed. Pt stated that his daughter causes him anxiety and that she lives next door to him. Pt stated that he does not agree with his daughters choices and that it causes them to not get along. Pt stated that as a result of his daughter that him and his wife have relationship problems.    Pt stated that he has not been motivated and has not had any energy over the last few weeks.  Pt stated that he has not "drank beer in the last four weeks". Pt stated that he was in therapy in the past for his anxiety and depression.  Pt stated that his mother died three years ago and that he lived with his mother to help care for her. Pt stated that has not had any cannabis in two weeks. Pt stated that he did not see a problem with using cannabis.   Pt stated that he is wanting to work on controlling his anger in therapy and that is wanting to help alleviate his symptoms of anxiety and depression. Pt stated that he does not feel that he copes with his feelings of anxiety and depression in a healthy manner. Pt was engaged in session and stated that he wanted to find new healthy ways to cope with stress, anxiety and depression.  LCSW answered any questions that the pt  had about the treatment plan and used motivational interviewing techniques to complete the CCA and treatment plan with the pt. LCSW showed unconditional positive regard and validated the pts thoughts and feelings.    Pt denies SI/HI or A/V hallucinations.Pt was cooperative during visit and was engaged throughout the visit.      CCA Screening, Triage and Referral (STR)  Patient Reported Information How did you hear about [place]? No data recorded Referral name: No data recorded Referral phone number: No data recorded  Whom do you see for routine medical problems? No data recorded Practice/Facility Name: No data recorded Practice/Facility Phone Number: No data recorded Name of Contact: No data recorded Contact Number: No data recorded Contact Fax Number: No data recorded Prescriber Name: No data recorded Prescriber Address (if known): No data recorded  What Is the Reason for Your Visit/Call Today? No data recorded How Long Has This Been Causing You Problems? No data recorded What Do You Feel Would Help You the Most Today? No data recorded  Have You Recently Been in Any Inpatient Treatment (Hospital/Detox/Crisis Center/28-Day Program)? No data recorded Name/Location of Program/Hospital:No data recorded How Long Were You There? No data recorded When Were You Discharged? No data recorded  Have You Ever Received Services From [facility] Before? No data recorded Who Do You See at  [facility]? No data recorded  Have You Recently Had Any Thoughts About Hurting Yourself? No data recorded Are You Planning to Commit Suicide/Harm Yourself At This time? No data recorded  Have you Recently Had Thoughts About Hurting Someone Karolee Ohs? No data recorded Explanation: No data recorded  Have You Used Any Alcohol or Drugs in the Past 24 Hours? No data recorded How Long Ago Did You Use Drugs or Alcohol? No data recorded What Did You Use and How Much? No data recorded  Do You Currently Have a  Therapist/Psychiatrist? No data recorded Name of Therapist/Psychiatrist: No data recorded  Have You Been Recently Discharged From Any Office Practice or Programs? No data recorded Explanation of Discharge From Practice/Program: No data recorded    CCA Screening Triage Referral Assessment Type of Contact: No data recorded Is this Initial or Reassessment? No data recorded Date Telepsych consult ordered in [facility]:  No data recorded Time Telepsych consult ordered in [facility]:  No data recorded  Patient Reported Information Reviewed? No data recorded Patient Left Without Being Seen? No data recorded Reason for Not Completing Assessment: No data recorded  Collateral Involvement: No data recorded  Does Patient Have a Court Appointed Legal Guardian? No data recorded Name and Contact of Legal Guardian: No data recorded If Minor and Not Living with Parent(s), Who has Custody? No data recorded Is CPS involved or ever been involved? No data recorded Is APS involved or ever been involved? No data recorded  Patient Determined To Be At Risk for Harm To Self or Others Based on Review of Patient Reported Information or Presenting Complaint? No data recorded Method: No data recorded Availability of Means: No data recorded Intent: No data recorded Notification Required: No data recorded Additional Information for Danger to Others Potential: No data recorded Additional Comments for Danger to Others Potential: No data recorded Are There Guns or Other Weapons in Your Home? No data recorded Types of Guns/Weapons: No data recorded Are These Weapons Safely Secured?                            No data recorded Who Could Verify You Are Able To Have These Secured: No data recorded Do You Have any Outstanding Charges, Pending Court Dates, Parole/Probation? No data recorded Contacted To Inform of Risk of Harm To Self or Others: No data recorded  Location of Assessment: No data recorded  Does Patient Present under  Involuntary Commitment? No data recorded IVC Papers Initial File Date: No data recorded  [place] of Residence: No data recorded  Patient Currently Receiving the Following Services: No data recorded  Determination of Need: No data recorded  Options For Referral: No data recorded    CCA Biopsychosocial Intake/Chief Complaint:  pt stated that he has anxiety and depression  Current Symptoms/Problems: pt stated he has external stressors with his family   Patient Reported Schizophrenia/Schizoaffective Diagnosis in Past: No   Strengths: painting cars in the past  Preferences: midmorning appoitments  Abilities: pt stated that he is a good father   Type of Services Patient Feels are Needed: therapy   Initial Clinical Notes/Concerns: No data recorded  Mental Health Symptoms Depression:  Change in energy/activity; Difficulty Concentrating; Sleep (too much or little); Weight gain/loss (pt stated that some nights he does not sleep well. Pt stated that wakes up with back pain and stated that he sleeps 6 hours a night.)   Duration of Depressive symptoms: No data recorded  Mania:  None   Anxiety:   Difficulty concentrating; Tension; Worrying; Fatigue; Irritability   Psychosis:  None   Duration of Psychotic symptoms: No data recorded  Trauma:  None   Obsessions:  None   Compulsions:  None   Inattention:  Forgetful; Loses things; Disorganized   Hyperactivity/Impulsivity:  Fidgets with hands/feet (pt stated that he moves his feet. Pt stated that he notices he moves his feet)   Oppositional/Defiant Behaviors:  Angry; Argumentative (pt stated that his family makes him angry)   Emotional Irregularity:  Intense/unstable relationships   Other Mood/Personality Symptoms:  No data recorded   Mental Status Exam Appearance and self-care  Stature:  Average   Weight:  Average weight   Clothing:  Neat/clean   Grooming:  Normal   Cosmetic use:  Age appropriate   Posture/gait:   Normal   Motor activity:  Not Remarkable   Sensorium  Attention:  Normal   Concentration:  Normal   Orientation:  X5   Recall/memory:  Normal   Affect and Mood  Affect:  Appropriate   Mood:  Euthymic   Relating  Eye contact:  Normal   Facial expression:  Responsive   Attitude toward examiner:  Dependent   Thought and Language  Speech flow: Clear and Coherent   Thought content:  Appropriate to Mood and Circumstances   Preoccupation:  None   Hallucinations:  None   Organization:  No data recorded  [employer] of Knowledge:  Average   Intelligence:  Average   Abstraction:  Concrete   Judgement:  Good   Reality Testing:  Adequate   Insight:  Present   Decision Making:  Normal   Social Functioning  Social Maturity:  Responsible   Social Judgement:  Normal   Stress  Stressors:  Family conflict; Relationship   Coping Ability:  Deficient supports   Skill Deficits:  Self-care (pt stated that he does not cope well)   Supports:  Friends/Service system (pt stated that he has one friend that he does not have any close relationships)     Religion: Religion/Spirituality Are You A Religious Person?: No  Leisure/Recreation: Leisure / Recreation Do You Have Hobbies?: Yes Leisure and Hobbies: fishing  Exercise/Diet: Exercise/Diet Do You Exercise?: No Have You Gained or Lost A Significant Amount of Weight in the Past Six Months?: No Do You Follow a Special Diet?: No Do You Have Any Trouble Sleeping?: Yes   CCA Employment/Education Employment/Work Situation: Employment / Work Situation Employment Situation: On disability Why is Patient on Disability: pt stated that he is on disability for his mental health How Long has Patient Been on Disability: 2011 Patient's Job has Been Impacted by Current Illness: Yes What is the Longest Time 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 Where was the Patient Employed at that  Time?: self-employed Has Patient ever Been in Equities trader?: No  Education: Education Is Patient Currently Attending School?: No Last Grade Completed: 8 Did Garment/textile technologist From [employer]?: No Did You Product manager?: No Did Designer, television/film set?: No Did You Have An Individualized Education Program (IIEP): No Did You Have Any Difficulty At School?: Yes (pt stated stated he has a hard time reading.) Were Any Medications Ever Prescribed For These Difficulties?: No Patient's Education Has Been Impacted by Current Illness: No   CCA Family/Childhood History Family and Relationship History: Family history Marital status: Married Number of Years Married: 30 What types of issues is patient dealing with in the relationship?:  married since he was 51 years old. Pt stated that his daughter causes problems in his marriage Does patient have children?: Yes How many children?: 2 How is patient's relationship with their children?: Pt stated that he has two daughters. Pt stated that one of his daughters lives close [place]. Pt stated that his daughter causes him stress.  Childhood History:  Childhood History By whom was/is the patient raised?: Both parents Additional childhood history information: Pt stated that he was rasied by his mother. Pt stated that he had a close relationship with his mother Description of patient's relationship with caregiver when they were a child: pt stated that his father died when he was 20 years old. Pt stated that his father taught him about life but he worked a lot. How were you disciplined when you got in trouble as a child/adolescent?: pt stated he was disciplined with a brother Does patient have siblings?: Yes Number of Siblings: 4 Description of patient's current relationship with siblings: two brothers and two sisters. Pt stated that his oldest brother is dead. Did patient suffer any verbal/emotional/physical/sexual abuse as a child?:  No Did patient suffer from severe childhood neglect?: No Has patient ever been sexually abused/assaulted/raped as an adolescent or adult?: No Was the patient ever a victim of a crime or a disaster?: No Witnessed domestic violence?: No Has patient been affected by domestic violence as an adult?: No  Child/Adolescent Assessment:     CCA Substance Use Alcohol/Drug Use: Alcohol / Drug Use History of alcohol / drug use?: Yes Longest period of sobriety (when/how long): pt stated that he has not any alcohol in four weeks Withdrawal Symptoms: None Substance #1 Name of Substance 1: Alcohol 1 - Age of First Use: 13 1 - Amount (size/oz): variable 1- Route of Use: legal Substance #2 Name of Substance 2: Cannabis 2 - Age of First Use: 13 2 - Amount (size/oz): variable 2 - Route of Substance Use: illegal                     ASAM's:  Six Dimensions of Multidimensional Assessment  Dimension 1:  Acute Intoxication and/or Withdrawal Potential:      Dimension 2:  Biomedical Conditions and Complications:      Dimension 3:  Emotional, Behavioral, or Cognitive Conditions and Complications:     Dimension 4:  Readiness to Change:     Dimension 5:  Relapse, Continued use, or Continued Problem Potential:     Dimension 6:  Recovery/Living Environment:     ASAM Severity Score:    ASAM Recommended Level of Treatment:     Substance use Disorder (SUD)    Recommendations for Services/Supports/Treatments:    DSM5 Diagnoses: Patient Active Problem List   Diagnosis Date Noted   Class 2 severe obesity due to excess calories with serious comorbidity and body mass index (BMI) of 38.0 to 38.9 in adult (HCC) 06/04/2022   GAD (generalized anxiety disorder) 06/04/2022   Depressive disorder 06/04/2022   Cannabis use disorder, mild, in early remission 06/04/2022   Polysubstance abuse (HCC) 06/04/2022   At risk for prolonged QT interval syndrome 06/04/2022   High risk medication use 06/04/2022    Medicare annual wellness visit, initial 02/08/2020   Obesity (BMI 35.0-39.9 without comorbidity) 08/31/2019   OSA on CPAP 08/31/2019   Postlaminectomy syndrome 08/30/2019   Cervical spondylosis with radiculopathy 06/07/2019   Other male erectile dysfunction 06/10/2018   Mixed hyperlipidemia 06/25/2016   Bipolar  affective disorder in remission [facility](HCC) 01/25/2016   Essential hypertension 01/25/2016   Vaccine counseling 01/25/2016    Patient Centered Plan: Patient is on the following Treatment Plan(s):  Anxiety and Depression   Referrals to Alternative Service(s): Referred to Alternative Service(s):   Place:   Date:   Time:    Referred to Alternative Service(s):   Place:   Date:   Time:    Referred to Alternative Service(s):   Place:   Date:   Time:    Referred to Alternative Service(s):   Place:   Date:   Time:      Collaboration of Care: Pt encouraged to continue care with psychiatrist of record Dr. Elna BreslowEappen.    Patient/Guardian was advised Release of Information must be obtained prior to any record release in order to collaborate their care with an outside provider. Patient/Guardian was advised if they have not already done so to contact the registration department to sign all necessary forms in order for us to release information regarding their care.   Consent: Patient/Guardian gives verbal consent for treatment and assignment of benefits for services provided during this visit. Patient/Guardian expressed understanding and agreed to proceed.   Holli Humblesarol A Chucky Homes

## 2022-07-05 NOTE — Plan of Care (Signed)
Initial treatment plan on 07/05/2022. Pt contributed to treatment plan and agreed to treatment plan.  Problem: Depression Goal:  Depression  Description: Recognize, accept and cope with feelings of depression per pt self report 3 out of 5 sessions.   Outcome: Not Progressing Goal: Depression  Description: Develop healthy thinking patterns about self, others and beliefs about self to help alleviate symptoms of depression per pt report 3 out 5 sessions.   Outcome: Not Progressing Goal: STG: [place] WILL PARTICIPATE IN AT LEAST 80% OF SCHEDULED INDIVIDUAL PSYCHOTHERAPY SESSIONS Outcome: Not Progressing   Problem: Anxiety  Goal:  Anxiety  Description: Resolve core conflicts that is the source of the anxiety per pt report 3 out of 5 sessions.   Outcome: Not Progressing Goal: Anxiety  Description: Stabilize anxiety level while increasing ability to function on a daily basis as reported by pt 3 out of 5 sessions.   Outcome: Not Progressing Goal: STG: Patient will participate in at least 80% of scheduled individual psychotherapy sessions Outcome: Not Progressing   Problem: Anger Management Goal:  Anger Description: Learn and implement coping skills that will result in a reduction of anger and improve daily functioning per pt self report 3 out of 5 sessions documented.   Outcome: Not Progressing

## 2022-07-18 ENCOUNTER — Ambulatory Visit: Payer: Self-pay | Admitting: Psychiatry

## 2022-07-24 ENCOUNTER — Ambulatory Visit (INDEPENDENT_AMBULATORY_CARE_PROVIDER_SITE_OTHER): Payer: Medicare Other | Admitting: Licensed Clinical Social Worker

## 2022-07-24 DIAGNOSIS — F32A Depression, unspecified: Secondary | ICD-10-CM | POA: Diagnosis not present

## 2022-07-24 DIAGNOSIS — F411 Generalized anxiety disorder: Secondary | ICD-10-CM

## 2022-07-24 NOTE — Progress Notes (Signed)
THERAPIST PROGRESS NOTE  Session Time: 10am-11am   Participation Level: Active  Behavioral Response: Well GroomedAlertEuthymic  Type of Therapy: Individual Therapy  Treatment Goals addressed: Anxiety: Reduce overall frequency, intensity and duration of anxiety so that daily functioning is not impaired per pt self report 3 out of 5 sessions.   ProgressTowards Goals: Progressing  Interventions: CBT and Supportive  Summary: Aaron Crosby. is a 51 y.o. male who presents with continuing symptoms of anxiety and depression.  Pt stated that he has been having anxiety because he worries about his family. Pt stated that his daughter who lives beside him just got a job recently and that he feels that she will not keep the job but through the holidays. Pt reported that he has has a difficult transition going from where he worked long shifts a day to not working at all and being a home. Pt was able to explore how the transition has impacted him in session and what he has been doing now that he is not working long shifts and how that makes him feel.   Pt reports that he get bored and that due to pain sometimes he is not able to do the things he once did. Pt reports that he enjoyed fishing in the past and that fishing was a way that he copes with feeling overwhelmed. Pt was able to explore in sessions ways that he could cope with his anxiety to include watching tv and spending time with his wife and spending time in his shop.   Allowed pt to explore thoughts and feelings associated with life situations and external stressors. Encouraged expression of feelings and used empathic listening. Pt was oriented to time, place and situation. LCSW validated the pts feelings and thoughts and showed unconditional positive regard.   Pt reported that he was going to try and find something new to do as a hobby and way to cope with his anxiety.   Pt reported that his family is coming to visit at Christmas and he was  looking forward to seeing his grandchildren. Pt stated that he worries about his wife and that he has been trying to not argue with his wife and that he has been trying to let go of the things that he can not control. Pt was able to explore in session how his thoughts impact how he feels and his behaviors.    Pt denies SI/HI or A/V hallucinations.Pt was cooperative during visit and was engaged throughout the visit. Pt does not report any other concerns at the time of visit.   Suicidal/Homicidal: Nowithout intent/plan  Therapist Response: Educated on the importance of healthy coping skills with the pt. Explored the benefits of healthy coping skills and engaged the pt to discuss ways that they are coping with anxiety. Encouraged the pt to explore new ways to help alleviate anxiety and determined new ways to help with distraction and coping in session. Dicussed the benefits of exercise as a way of coping with anxiety and stress. Explored different ways that the pt could cope to include listening to music, being in nature, trying a new hobby and other activities that the pt could try to help with coping.    LCSW used CBT techniques to explore how thoughts,feelings and beliefs impact behavorial responses and physical symptoms. Explored how stressors can lead to negative thoughts that can be irrational or exaggerated which in return impacts how the person feels and can negatively impact how their body responds to  include fatigue, sleep problems, poor concentration and loss of motivation. Explored how a behavioral response can create new stressors. Engaged with the pt to explore how their thoughts and feelings impact their behaviors and choices.     Continued Recommendations as followed: Self-care behaviors, positive social engagements, focusing on positive physical and emotional wellness, and focusing on life/work balance.    Plan: Return again on 08/19/2022 for therapy with LCSW.   Diagnosis: GAD  (generalized anxiety disorder)  Depressive disorder  Collaboration of Care: Pt encouraged to continue care with psychiatrist of record Dr. Elna Breslow.    Patient/Guardian was advised Release of Information must be obtained prior to any record release in order to collaborate their care with an outside provider. Patient/Guardian was advised if they have not already done so to contact the registration department to sign all necessary forms in order for [place] to release information regarding their care.   Consent: Patient/Guardian gives verbal consent for treatment and assignment of benefits for services provided during this visit. Patient/Guardian expressed understanding and agreed to proceed.   Aaron Crosby 07/24/2022

## 2022-08-06 ENCOUNTER — Telehealth: Payer: Self-pay

## 2022-08-06 DIAGNOSIS — F32A Depression, unspecified: Secondary | ICD-10-CM

## 2022-08-06 MED ORDER — QUETIAPINE FUMARATE 200 MG PO TABS: 200.0000 mg | ORAL_TABLET | Freq: Every day | ORAL | 0 refills | Status: DC

## 2022-08-06 NOTE — Telephone Encounter (Signed)
I have sent Seroquel 200 mg 90 days supply to [facility] [place].

## 2022-08-06 NOTE — Telephone Encounter (Signed)
received fax for a refill for the quetiapine fumarate 200mg  take 1 tablety by mouth once daily.

## 2022-08-07 NOTE — Telephone Encounter (Signed)
Pt.notified

## 2022-08-19 ENCOUNTER — Ambulatory Visit (INDEPENDENT_AMBULATORY_CARE_PROVIDER_SITE_OTHER): Payer: Medicare Other | Admitting: Licensed Clinical Social Worker

## 2022-08-19 DIAGNOSIS — F32A Depression, unspecified: Secondary | ICD-10-CM | POA: Diagnosis not present

## 2022-08-19 DIAGNOSIS — F411 Generalized anxiety disorder: Secondary | ICD-10-CM

## 2022-08-19 NOTE — Progress Notes (Signed)
THERAPIST PROGRESS NOTE  Session Time: 11:00am-11:58am  Participation Level: Active  Behavioral Response: Well GroomedAlertEuthymic  Type of Therapy: Individual Therapy  Treatment Goals addressed: Anxiety: Reduce overall frequency, intensity and duration of anxiety so that daily functioning is not impaired per pt self report 3 out of 5 sessions.   ProgressTowards Goals: Progressing  Interventions: CBT and Supportive  Summary: Aaron Crosby. is a 52 y.o. male who presents with continuing symptoms of anxiety and depression.  Pt reported that he has been trying to cope with his anxiety by [place] and being around his [place] and being outside. Pt stated that his daughter who lives beside him just got a job recently and that she has quit her job since the last session. Pt reported that his daughters choices do cause him stress and that he wants what is best for her.   LCSW provided mood monitoring and treatment progress review in the context of this episode of treatment. LCSW reviewed the pt's mood status since last session.  Pt stated that his mood has been "fine" since the last visit and that he has been trying to not get in situations or conversations that cause him stress. Pt reported that he feels that he has been able to use " I feel" statements with his wife and how he feels about his daughter and other stressors that he has.    Allowed pt to explore thoughts and feelings associated with life situations and external stressors. Encouraged expression of feelings and used empathic listening. Pt was oriented to time, place and situation. LCSW validated the pts feelings and thoughts and showed unconditional positive regard.   Pt reported that he was finding that walking away from situations where he is not in control has been helpful. Pt was able to explore in session how his thoughts impact how he feels and his behaviors. Pt was able to explore in session ways to cope with his  anxiety.    Pt reported that he did not want to schedule a follow-up visit with LCSW due to the transition at this time and that he would be willing to see the new therapist when she starts.     Pt denies SI/HI or A/V hallucinations.Pt was cooperative during visit and was engaged throughout the visit. Pt does not report any other concerns at the time of visit.   Suicidal/Homicidal: Nowithout intent/plan  Therapist Response:  Discussed with the pt appropriate communication and explored with the pt the use of "I" statements. Explored how when a person feels that they are being blamed even if they are right or wrong that a common response is defensiveness. By using "I" statements it allows a person to reduce the feelings of blame and take responsibility for ones' own feelings while being tactful in describing their feelings and the situation. Encouraged the pt to explore how the "I feel" statements must be followed with an emotion word and that when explaining the situation to someone else it is important to gently describe how the other persons actions and choices are affecting them. Explored in session with the pt how they can use effective communication and use reflection to help them improve effective healthy communication with others.    Encouraged the pt to participate in activities in which they will experience success and achievement. Explored with the pt distraction as a coping mechanism and explored cognitive distractions, behavioral distraction and physiological distractions. Provided pt with examples of each form of  distractions and explored with pt new ways that they could implement the distractions into their daily routine when they face difficult emotions and stressors.    LCSW used CBT techniques to explore how thoughts,feelings and beliefs impact behavorial responses and physical symptoms. Explored how stressors can lead to negative thoughts that can be irrational or exaggerated which in  return impacts how the person feels and can negatively impact how their body responds to include fatigue, sleep problems, poor concentration and loss of motivation. Explored how a behavioral response can create new stressors. Engaged with the pt to explore how their thoughts and feelings impact their behaviors and choices.    Discussed with the pt the transition of a new therapist and provided the pt with the choice of continuing services with the new therapist and answered any questions that the pt had about the transition. Pt reported that he did not want to make a follow-up visit at this time due to the planned departure and that he was willing to be transferred when the new therapist starts.   Continued Recommendations as followed: Self-care behaviors, positive social engagements, focusing on positive physical and emotional wellness, and focusing on life/work balance.      Plan: Pt reported that he did not want to make a therapy follow-up visit at this time due to the planned departure and that he was willing to be transferred when the new therapist starts.    Diagnosis:  Encounter Diagnoses  Name Primary?   Depressive disorder Yes   GAD (generalized anxiety disorder)      Collaboration of Care: Pt encouraged to continue care with psychiatrist of record Dr. Shea Evans.    Patient/Guardian was advised Release of Information must be obtained prior to any record release in order to collaborate their care with an outside provider. Patient/Guardian was advised if they have not already done so to contact the registration department to sign all necessary forms in order for [place] to release information regarding their care.   Consent: Patient/Guardian gives verbal consent for treatment and assignment of benefits for services provided during this visit. Patient/Guardian expressed understanding and agreed to proceed.   Lorenda Hatchet 08/19/2022

## 2022-09-11 LAB — COLOGUARD: COLOGUARD: NEGATIVE

## 2022-09-30 ENCOUNTER — Ambulatory Visit: Payer: Medicare Other | Admitting: Psychiatry

## 2022-10-03 ENCOUNTER — Ambulatory Visit (INDEPENDENT_AMBULATORY_CARE_PROVIDER_SITE_OTHER): Payer: Medicare Other | Admitting: Psychiatry

## 2022-10-03 ENCOUNTER — Encounter: Payer: Self-pay | Admitting: Psychiatry

## 2022-10-03 VITALS — BP 110/73 | HR 69 | Temp 97.1°F | Ht 70.0 in | Wt 245.8 lb

## 2022-10-03 DIAGNOSIS — F3341 Major depressive disorder, recurrent, in partial remission: Secondary | ICD-10-CM

## 2022-10-03 DIAGNOSIS — F1211 Cannabis abuse, in remission: Secondary | ICD-10-CM | POA: Diagnosis not present

## 2022-10-03 DIAGNOSIS — F411 Generalized anxiety disorder: Secondary | ICD-10-CM

## 2022-10-03 DIAGNOSIS — F1910 Other psychoactive substance abuse, uncomplicated: Secondary | ICD-10-CM | POA: Diagnosis not present

## 2022-10-03 NOTE — Progress Notes (Signed)
[facility] OP Progress Note  10/03/2022 11:23 AM Aaron D Diop Jr.  MRN:  [id number]  Chief Complaint:  Chief Complaint  Patient presents with   Follow-up   Anxiety   Medication Refill   HPI: HARVARD QUATTROCHI Crosbyis a 52 year old Caucasian male on disability, lives in [place], married, has a history of bipolar disorder( per report), GAD, cannabis use disorder, polysubstance abuse-cocaine, alcohol, hallucinogen abuse in remission, hypertension, hyperlipidemia, obesity, chronic back pain was evaluated in the office today.  Patient today reports he currently feels much better with regards to his mood symptoms.  His situational stressors are currently better.  His daughter currently has a job and that is a big improvement.  That does help him to feel better also.  Patient does report a history of anger issues, irritability several years ago however patient also has a history of cannabis, cocaine, hallucinogen, alcohol abuse which likely also could have contributed to mood symptoms in the past.  Patient unable to give any details about manic or hypomanic symptoms other than just being angry all the time.   Patient reports sleep is overall good on the Seroquel.  Does have on and off tremors however nothing observed in session today.  Not interested in reducing the dosage of Seroquel or adding Cogentin.  Would like to wait and watch.  Patient reports he is currently cutting back on cannabis use, was able to get some cannabis Gummies from [place] and currently takes small bites of it couple of times a week only.  He is trying to cut back.  Has not used alcohol in the past 4 months.  Patient currently denies any suicidality, homicidality or perceptual disturbances.  Reports his blood pressure is currently under control since his primary care provider made some changes with his blood pressure medication.  Patient continues to be compliant on the Cymbalta.  Denies side effects.  Patient is hesitant about  establishing care with a new therapist since he is not good about talking to other people.  However patient agrees to give it a chance.  Patient denies any other concerns today.  Visit Diagnosis:    ICD-10-CM   1. Recurrent major depressive disorder, in partial remission ([place])  F33.41     2. GAD (generalized anxiety disorder)  F41.1     3. Cannabis use disorder, mild, in early remission  F12.11     4. Polysubstance abuse ([place])  F19.10    cocaine, alcohol, hallucinogen      Past Psychiatric History: Reviewed past psych history from progress note on 06/04/2022.  Past Medical History:  Past Medical History:  Diagnosis Date   Anxiety    Depression     Past Surgical History:  Procedure Laterality Date   BACK SURGERY     CARPAL TUNNEL RELEASE      Family Psychiatric History: Reviewed family psychiatric history from progress note on 06/04/2022.  Family History:  Family History  Problem Relation Age of Onset   Dementia Mother    Suicidality Cousin    Autism spectrum disorder Grandson    Drug abuse Daughter     Social History: Reviewed social history from progress note on 06/04/2022. Social History   Socioeconomic History   Marital status: Married    Spouse name: Not on file   Number of children: 2   Years of education: Not on file   Highest education level: 8th grade  Occupational History   Not on file  Tobacco Use   Smoking status:  Former   Smokeless tobacco: Never  Scientific laboratory technician Use: Never used  Substance and Sexual Activity   Alcohol use: Yes    Alcohol/week: 6.0 standard drinks of alcohol    Types: 6 Cans of beer per week   Drug use: Yes    Types: Marijuana   Sexual activity: Yes    Birth control/protection: None  Other Topics Concern   Not on file  Social History Narrative   Not on file   Social Determinants of Health   Financial Resource Strain: Not on file  Food Insecurity: Not on file  Transportation Needs: Not on file  Physical  Activity: Not on file  Stress: Not on file  Social Connections: Not on file    Allergies: No Known Allergies  Metabolic Disorder Labs: No results found for: "HGBA1C", "MPG" No results found for: "PROLACTIN" No results found for: "CHOL", "TRIG", "HDL", "CHOLHDL", "VLDL", "LDLCALC" No results found for: "TSH"  Therapeutic Level Labs: No results found for: "LITHIUM" No results found for: "VALPROATE" No results found for: "CBMZ"  Current Medications: Current Outpatient Medications  Medication Sig Dispense Refill   DULoxetine (CYMBALTA) 30 MG capsule Take 1 capsule (30 mg total) by mouth daily. Dose change 90 capsule 0   fluticasone (FLONASE) 50 MCG/ACT nasal spray Place into the nose.     lisinopril-hydrochlorothiazide (ZESTORETIC) 20-25 MG tablet Take 1 tablet by mouth daily.     omeprazole (PRILOSEC) 40 MG capsule Take 40 mg by mouth daily.     QUEtiapine (SEROQUEL) 200 MG tablet Take 1 tablet (200 mg total) by mouth at bedtime. 90 tablet 0   tadalafil (CIALIS) 10 MG tablet Take 10 mg by mouth daily as needed for erectile dysfunction.     No current facility-administered medications for this visit.     Musculoskeletal: Strength & Muscle Tone: within normal limits Gait & Station: normal Patient leans: N/A  Psychiatric Specialty Exam: Review of Systems  Neurological:  Positive for tremors (on and off).  Psychiatric/Behavioral: Negative.    All other systems reviewed and are negative.   Blood pressure 110/73, pulse 69, temperature (!) 97.1 F (36.2 C), temperature source Skin, height '5\' 10"'$  (1.778 m), weight 245 lb 12.8 oz (111.5 kg).Body mass index is 35.27 kg/m.  General Appearance: Casual  Eye Contact:  Fair  Speech:  Clear and Coherent  Volume:  Normal  Mood:  Euthymic  Affect:  Congruent  Thought Process:  Goal Directed and Descriptions of Associations: Intact  Orientation:  Full (Time, Place, and Person)  Thought Content: Logical   Suicidal Thoughts:  No   Homicidal Thoughts:  No  Memory:  Immediate;   Fair Recent;   Fair Remote;   Fair  Judgement:  Fair  Insight:  Fair  Psychomotor Activity:  Normal  Concentration:  Concentration: Fair and Attention Span: Fair  Recall:  [employer] of Knowledge: Fair  Language: Fair  Akathisia:  No  Handed:  Left  AIMS (if indicated): done  Assets:  Communication Skills Desire for Improvement Social Support Transportation  ADL's:  Intact  Cognition: WNL  Sleep:  Fair   Screenings: [place] Visit from 10/03/2022 in [facility] Total Score 0      GAD-7    [facility] Office Visit from 10/03/2022 in [facility] from 08/19/2022 in [facility] from 07/24/2022 in [facility] Visit from 07/05/2022 in [facility] Office Visit from 07/01/2022 in [facility]  Total GAD-7 Score '3 5 4 4 3      '$ PHQ2-9    [facility] Office Visit from 10/03/2022 in [facility] from 08/19/2022 in [facility] from 07/24/2022 in [facility] Office Visit from 07/05/2022 in [facility] Office Visit from 07/01/2022 in [facility]  PHQ-2 Total Score 1 1 0 0 0  PHQ-9 Total Score '3 4 3 3 4      '$ [facility] Office Visit from 10/03/2022 in [facility] from 08/19/2022 in [facility] from 07/24/2022 in [facility] No Risk No Risk No Risk         Assessment and Plan: Aarn Blocker. Is a 52 year old Caucasian male, on disability, lives in [place], married, has a history of anxiety disorder, depression, elevated blood pressure and multiple other medical problems was evaluated in the office today.  Patient is currently improving, will benefit from the following plan.  Plan MDD in partial remission Cymbalta 30 mg p.o. daily-dose reduced due to jitteriness and elevated blood pressure reading in the past Continue Seroquel 200 mg p.o. nightly-discussed reducing the dosage since he continues to report on and off tremors although nothing observed in session today.  Patient would like to stay on this dosage and wait and watch. Discussed adding Cogentin low dosage as needed, patient declines. Patient to continue CBT-has upcoming appointment with a new therapist.  GAD-improving Cymbalta 30 mg p.o. daily Patient to continue CBT  Cannabis use disorder in early remission Patient is currently trying to cut back. Provided counseling again.  Polysubstance use-history of cocaine, LSD, alcohol abuse in remission Will monitor closely  Follow-up in clinic in 3 months or sooner if needed.    Consent: Patient/Guardian gives verbal consent for treatment and assignment of benefits for services provided during this visit. Patient/Guardian expressed understanding and agreed to proceed.   This note was generated in part or whole with voice recognition software. Voice recognition is usually quite accurate but there are transcription errors that can and very often do occur. I apologize for any typographical errors that were not detected and corrected.      Ursula Alert, MD 10/04/2022, 8:32 AM

## 2022-10-28 ENCOUNTER — Ambulatory Visit (INDEPENDENT_AMBULATORY_CARE_PROVIDER_SITE_OTHER): Payer: Self-pay | Admitting: Licensed Clinical Social Worker

## 2022-10-28 DIAGNOSIS — Z91199 Patient's noncompliance with other medical treatment and regimen due to unspecified reason: Secondary | ICD-10-CM

## 2022-10-28 NOTE — Progress Notes (Signed)
Pt called front desk at 8:05AM to cancel appt. Pt marked as late cancellation/no show.

## 2022-12-11 ENCOUNTER — Other Ambulatory Visit: Payer: Self-pay | Admitting: Psychiatry

## 2022-12-11 DIAGNOSIS — F32A Depression, unspecified: Secondary | ICD-10-CM

## 2023-01-01 ENCOUNTER — Ambulatory Visit: Payer: Medicare Other | Admitting: Psychiatry

## 2023-01-15 ENCOUNTER — Ambulatory Visit (INDEPENDENT_AMBULATORY_CARE_PROVIDER_SITE_OTHER): Payer: Medicare Other | Admitting: Psychiatry

## 2023-01-15 ENCOUNTER — Encounter: Payer: Self-pay | Admitting: Psychiatry

## 2023-01-15 VITALS — BP 120/77 | HR 89 | Temp 97.9°F | Ht 70.0 in | Wt 228.6 lb

## 2023-01-15 DIAGNOSIS — Z79899 Other long term (current) drug therapy: Secondary | ICD-10-CM

## 2023-01-15 DIAGNOSIS — F3342 Major depressive disorder, recurrent, in full remission: Secondary | ICD-10-CM | POA: Diagnosis not present

## 2023-01-15 DIAGNOSIS — F1910 Other psychoactive substance abuse, uncomplicated: Secondary | ICD-10-CM

## 2023-01-15 DIAGNOSIS — F411 Generalized anxiety disorder: Secondary | ICD-10-CM

## 2023-01-15 DIAGNOSIS — F1211 Cannabis abuse, in remission: Secondary | ICD-10-CM | POA: Diagnosis not present

## 2023-01-15 MED ORDER — DULOXETINE HCL 30 MG PO CPEP: 30.0000 mg | ORAL_CAPSULE | Freq: Every day | ORAL | 0 refills | Status: DC

## 2023-01-15 NOTE — Progress Notes (Signed)
BH MD OP Progress Note  01/15/2023 4:00 PM Aaron D Dahlem Jr.  MRN:  [id number]  Chief Complaint:  Chief Complaint  Patient presents with   Follow-up   Anxiety   Depression   Medication Refill   HPI: Aaron Crosby. is a 52 year old Caucasian male, on disability, lives in [place], married, has a history of MDD, GAD, cannabis use disorder, polysubstance abuse-cocaine, alcohol, hallucinogen abuse in remission, hypertension, hyperlipidemia, obesity, chronic back pain was evaluated in office today.  Patient today reports he continues to be worried about his daughter.  His daughter is no longer is at the same job that she had at the [employer].  Patient reports she went through sexual harassment at that job and is currently just doing odd jobs here and there.  She lives in a trailer right behind his house.  Patient reports she trashed the house she had previously and he had to get her a new one.  He hence has been supporting her as best as he can.  She struggles with lack of motivation and that worries him.  He continues to try to get her help although she is refusing it.  Patient has been noncompliant with psychotherapy visits.  Encouraged to make an appointment.  Patient is currently compliant on the duloxetine and Seroquel which helps with his mood.  Patient reports that Seroquel helps him to fall asleep most nights however couple of times a week sleep is interrupted.  He however is not interested in adding a sleep medication.  Patient does report mild tremors on and off likely from the Seroquel.  He is not interested in changing the dosage or adding another medication for problems at this time.  Patient reports he does use CBD, however he is aware it may have THC in it.  Patient reports he is not interested in quitting at this time.  Patient denies any suicidality, homicidality or perceptual disturbances.  Patient denies any other concerns today.  Visit Diagnosis:    ICD-10-CM   1. MDD  (major depressive disorder), recurrent, in full remission (HCC)  F33.42 Hemoglobin A1C    Prolactin    2. GAD (generalized anxiety disorder)  F41.1 Hemoglobin A1C    DULoxetine (CYMBALTA) 30 MG capsule    3. Cannabis use disorder, mild, in early remission  F12.11     4. Polysubstance abuse (HCC)  F19.10    per history, cocaine and alcohol and hallucinogen    5. High risk medication use  Z79.899 Hemoglobin A1C    Prolactin      Past Psychiatric History: I have reviewed past psychiatric history from progress note on 06/04/2022.  Past Medical History:  Past Medical History:  Diagnosis Date   Anxiety    Depression     Past Surgical History:  Procedure Laterality Date   BACK SURGERY     CARPAL TUNNEL RELEASE      Family Psychiatric History: I have reviewed family psychiatric history from progress note on 06/04/2022.  Family History:  Family History  Problem Relation Age of Onset   Dementia Mother    Suicidality Cousin    Autism spectrum disorder Grandson    Drug abuse Daughter     Social History: I have reviewed social history from progress note on 06/04/2022. Social History   Socioeconomic History   Marital status: Married    Spouse name: Not on file   Number of children: 2   Years of education: Not on file   Highest  education level: 8th grade  Occupational History   Not on file  Tobacco Use   Smoking status: Former   Smokeless tobacco: Never  Vaping Use   Vaping Use: Never used  Substance and Sexual Activity   Alcohol use: Yes    Alcohol/week: 6.0 standard drinks of alcohol    Types: 6 Cans of beer per week   Drug use: Yes    Types: Marijuana   Sexual activity: Yes    Birth control/protection: None  Other Topics Concern   Not on file  Social History Narrative   Not on file   Social Determinants of Health   Financial Resource Strain: Not on file  Food Insecurity: Not on file  Transportation Needs: Not on file  Physical Activity: Not on file   Stress: Not on file  Social Connections: Not on file    Allergies: No Known Allergies  Metabolic Disorder Labs: No results found for: "HGBA1C", "MPG" No results found for: "PROLACTIN" No results found for: "CHOL", "TRIG", "HDL", "CHOLHDL", "VLDL", "LDLCALC" No results found for: "TSH"  Therapeutic Level Labs: No results found for: "LITHIUM" No results found for: "VALPROATE" No results found for: "CBMZ"  Current Medications: Current Outpatient Medications  Medication Sig Dispense Refill   lisinopril-hydrochlorothiazide (ZESTORETIC) 20-25 MG tablet Take 1 tablet by mouth daily.     omeprazole (PRILOSEC) 40 MG capsule Take 40 mg by mouth daily.     QUEtiapine (SEROQUEL) 200 MG tablet TAKE 1 TABLET BY MOUTH AT BEDTIME 90 tablet 0   tadalafil (CIALIS) 10 MG tablet Take 10 mg by mouth daily as needed for erectile dysfunction.     DULoxetine (CYMBALTA) 30 MG capsule Take 1 capsule (30 mg total) by mouth daily. Dose change 90 capsule 0   fluticasone (FLONASE) 50 MCG/ACT nasal spray Place into the nose. (Patient not taking: Reported on 01/15/2023)     No current facility-administered medications for this visit.     Musculoskeletal: Strength & Muscle Tone: within normal limits Gait & Station: normal Patient leans: N/A  Psychiatric Specialty Exam: Review of Systems  Neurological:  Positive for tremors (does report tremors on and off).  Psychiatric/Behavioral:  Positive for sleep disturbance. The patient is nervous/anxious.     Blood pressure 120/77, pulse 89, temperature 97.9 F (36.6 C), temperature source Skin, height 5\' 10"  (1.778 m), weight 228 lb 9.6 oz (103.7 kg).Body mass index is 32.8 kg/m.  General Appearance: Fairly Groomed  Eye Contact:  Fair  Speech:  Normal Rate  Volume:  Normal  Mood:  Anxious  Affect:  Congruent  Thought Process:  Goal Directed and Descriptions of Associations: Intact  Orientation:  Full (Time, Place, and Person)  Thought Content: Logical    Suicidal Thoughts:  No  Homicidal Thoughts:  No  Memory:  Immediate;   Fair Recent;   Fair Remote;   Fair  Judgement:  Fair  Insight:  Fair  Psychomotor Activity:  Normal  Concentration:  Concentration: Fair and Attention Span: Fair  Recall:  [employer] of Knowledge: Fair  Language: Fair  Akathisia:  No  Handed:  Left  AIMS (if indicated): not done  Assets:  Communication Skills Desire for Improvement Housing Social Support  ADL's:  Intact  Cognition: WNL  Sleep:  Poor   Screenings: AIMS    Flowsheet Row Office Visit from 01/15/2023 in [facility] Office Visit from 10/03/2022 in [facility]  AIMS Total Score 0 0  GAD-7    Flowsheet Row Office Visit from 01/15/2023 in [facility] Office Visit from 10/03/2022 in [facility] Counselor from 08/19/2022 in [facility] Counselor from 07/24/2022 in [facility] Office Visit from 07/05/2022 in [facility]  Total GAD-7 Score 6 3 5 4 4       PHQ2-9    Flowsheet Row Office Visit from 01/15/2023 in [facility] Office Visit from 10/03/2022 in [facility] Counselor from 08/19/2022 in [facility] Counselor from 07/24/2022 in [facility] Office Visit from 07/05/2022 in [facility]  PHQ-2 Total Score 0 1 1 0 0  PHQ-9 Total Score 1 3 4 3 3       Flowsheet Row Office Visit from 01/15/2023 in [facility] Office Visit from 10/03/2022 in [facility] Counselor from 08/19/2022 in [facility]  C-SSRS RISK CATEGORY No Risk No Risk No Risk        Assessment and Plan: Aaron Crosby. is a 52 year old Caucasian male on disability, lives in [place], married, has a history of anxiety disorder, depression, elevated blood pressure, multiple medical problems was evaluated in office today.  Patient with continued anxiety mostly situational, noncompliant with therapy appointments, will benefit from the following plan.  Plan MDD in remission Cymbalta 30 mg p.o. daily. Seroquel 200 mg p.o. nightly. Patient not interested in adding Cogentin low dosage for tremors likely due to Seroquel.   GAD-improving Patient encouraged to continue CBT.  I have also communicated with his therapist Ms. [facility]. Continue Cymbalta 30 mg p.o. daily  Cannabis use disorder in early remission Provided counseling  Polysubstance use-history of cocaine, LSD, alcohol abuse in remission. Will monitor closely  High risk medication use-will order hemoglobin A1c, prolactin level.  Patient to go to [facility] lab.  Follow-up in clinic in 3 months or sooner if needed. Collaboration of Care: Collaboration of Care: Referral or follow-up with counselor/therapist AEB patient encouraged to continue CBT.  I have coordinated care.  Patient/Guardian was advised Release of Information must be obtained prior to any record release in order to collaborate their care with an outside provider. Patient/Guardian was advised if they have not already done so to contact the registration department to sign all necessary forms in order for [place] to release information regarding their care.   Consent: Patient/Guardian gives verbal consent for treatment and assignment of benefits for services provided during this visit. Patient/Guardian expressed understanding and agreed to proceed.   This note was generated in part or whole with voice recognition software. Voice recognition is usually quite  accurate but there are transcription errors that can and very often do occur. I apologize for any typographical errors that were not detected and corrected.    Jomarie Longs, MD 01/16/2023, 9:25 AM

## 2023-03-03 ENCOUNTER — Other Ambulatory Visit: Payer: Self-pay | Admitting: Psychiatry

## 2023-03-03 DIAGNOSIS — F32A Depression, unspecified: Secondary | ICD-10-CM

## 2023-04-17 ENCOUNTER — Ambulatory Visit: Payer: Medicare Other | Admitting: Psychiatry

## 2023-05-15 ENCOUNTER — Encounter: Payer: Self-pay | Admitting: Psychiatry

## 2023-05-15 ENCOUNTER — Ambulatory Visit (INDEPENDENT_AMBULATORY_CARE_PROVIDER_SITE_OTHER): Payer: Medicare Other | Admitting: Psychiatry

## 2023-05-15 VITALS — BP 138/74 | HR 88 | Temp 98.7°F | Ht 70.0 in | Wt 239.0 lb

## 2023-05-15 DIAGNOSIS — F3342 Major depressive disorder, recurrent, in full remission: Secondary | ICD-10-CM

## 2023-05-15 DIAGNOSIS — F411 Generalized anxiety disorder: Secondary | ICD-10-CM | POA: Diagnosis not present

## 2023-05-15 DIAGNOSIS — F1211 Cannabis abuse, in remission: Secondary | ICD-10-CM

## 2023-05-15 DIAGNOSIS — F1910 Other psychoactive substance abuse, uncomplicated: Secondary | ICD-10-CM

## 2023-05-15 MED ORDER — QUETIAPINE FUMARATE 200 MG PO TABS: 200.0000 mg | ORAL_TABLET | Freq: Every day | ORAL | 1 refills | Status: DC

## 2023-05-15 MED ORDER — DULOXETINE HCL 30 MG PO CPEP: 30.0000 mg | ORAL_CAPSULE | Freq: Every day | ORAL | 1 refills | Status: DC

## 2023-05-15 NOTE — Progress Notes (Signed)
BH MD OP Progress Note  05/15/2023 9:54 AM Aaron D Mathe Jr.  MRN:  [id number]  Chief Complaint:  Chief Complaint  Patient presents with   Follow-up   Anxiety   Depression   Medication Refill   HPI: Aaron Crosbyis a 52 year old Caucasian male on disability, lives in [place], married, has a history of MDD, GAD, cannabis use disorder, polysubstance abuse-cocaine, alcohol, hallucinogen abuse in remission, hypertension, hyperlipidemia, obesity, chronic back pain was evaluated in office today.  Patient today reports he is currently doing overall okay on the current medication regimen.  Continues to be compliant on the Seroquel, Cymbalta.  Patient does report intermittent tremors likely from the Seroquel.  However currently not interested in addition of Cogentin.  Reports he is coping okay with it.  Will let this provider know if interested.  Patient denies any sadness, hopelessness.  Reports anxiety is manageable.  Patient reports sleep is overall good.  Patient reports appetite is fair.  Patient denies any suicidality, homicidality or perceptual disturbances.  Patient reports he was able to go for a cruise recently.  Reports he enjoyed it.  Patient denies any other concerns today.  Visit Diagnosis:    ICD-10-CM   1. MDD (major depressive disorder), recurrent, in full remission (HCC)  F33.42 QUEtiapine (SEROQUEL) 200 MG tablet    2. GAD (generalized anxiety disorder)  F41.1 DULoxetine (CYMBALTA) 30 MG capsule    3. Cannabis use disorder, mild, in early remission  F12.11     4. Polysubstance abuse (HCC)  F19.10    per history, cocaine, hallucinogen,alcohol      Past Psychiatric History: I have reviewed past psychiatric history from progress note on 06/04/2022.  Past Medical History:  Past Medical History:  Diagnosis Date   Anxiety    Depression     Past Surgical History:  Procedure Laterality Date   BACK SURGERY     CARPAL TUNNEL RELEASE      Family Psychiatric  History: I have reviewed family psychiatric history from progress note on 06/04/2022.  Family History:  Family History  Problem Relation Age of Onset   Dementia Mother    Suicidality Cousin    Autism spectrum disorder Grandson    Drug abuse Daughter     Social History: I have reviewed social history from progress note on 06/04/2022. Social History   Socioeconomic History   Marital status: Married    Spouse name: Not on file   Number of children: 2   Years of education: Not on file   Highest education level: 8th grade  Occupational History   Not on file  Tobacco Use   Smoking status: Former   Smokeless tobacco: Never  Vaping Use   Vaping status: Never Used  Substance and Sexual Activity   Alcohol use: Yes    Alcohol/week: 6.0 standard drinks of alcohol    Types: 6 Cans of beer per week   Drug use: Yes    Types: Marijuana   Sexual activity: Yes    Birth control/protection: None  Other Topics Concern   Not on file  Social History Narrative   Not on file   Social Determinants of Health   Financial Resource Strain: Not on file  Food Insecurity: Not on file  Transportation Needs: Not on file  Physical Activity: Not on file  Stress: Not on file  Social Connections: Not on file    Allergies: No Known Allergies  Metabolic Disorder Labs: No results found for: "HGBA1C", "MPG" No results  found for: "PROLACTIN" No results found for: "CHOL", "TRIG", "HDL", "CHOLHDL", "VLDL", "LDLCALC" No results found for: "TSH"  Therapeutic Level Labs: No results found for: "LITHIUM" No results found for: "VALPROATE" No results found for: "CBMZ"  Current Medications: Current Outpatient Medications  Medication Sig Dispense Refill   fluticasone (FLONASE) 50 MCG/ACT nasal spray Place into the nose.     lisinopril-hydrochlorothiazide (ZESTORETIC) 20-25 MG tablet Take 1 tablet by mouth daily.     omeprazole (PRILOSEC) 40 MG capsule Take 40 mg by mouth daily.     tadalafil (CIALIS)  10 MG tablet Take 10 mg by mouth daily as needed for erectile dysfunction.     DULoxetine (CYMBALTA) 30 MG capsule Take 1 capsule (30 mg total) by mouth daily. Dose change 90 capsule 1   QUEtiapine (SEROQUEL) 200 MG tablet Take 1 tablet (200 mg total) by mouth at bedtime. 90 tablet 1   No current facility-administered medications for this visit.     Musculoskeletal: Strength & Muscle Tone: within normal limits Gait & Station: normal Patient leans: N/A  Psychiatric Specialty Exam: Review of Systems  Psychiatric/Behavioral:  Positive for sleep disturbance (improving).     Blood pressure 138/74, pulse 88, temperature 98.7 F (37.1 C), temperature source Temporal, height 5\' 10"  (1.778 m), weight 239 lb (108.4 kg), SpO2 99%.Body mass index is 34.29 kg/m.  General Appearance: Fairly Groomed  Eye Contact:  Fair  Speech:  Clear and Coherent  Volume:  Normal  Mood:  Euthymic  Affect:  Congruent  Thought Process:  Goal Directed and Descriptions of Associations: Intact  Orientation:  Full (Time, Place, and Person)  Thought Content: Logical   Suicidal Thoughts:  No  Homicidal Thoughts:  No  Memory:  Immediate;   Fair Recent;   Fair Remote;   Fair  Judgement:  Fair  Insight:  Fair  Psychomotor Activity:  Normal  Concentration:  Concentration: Fair and Attention Span: Fair  Recall:  [employer] of Knowledge: Fair  Language: Fair  Akathisia:  No  Handed:  Right  AIMS (if indicated): done  Assets:  Communication Skills Desire for Improvement Housing Social Support  ADL's:  Intact  Cognition: WNL  Sleep:   improving   Screenings: Geneticist, molecular Office Visit from 05/15/2023 in [facility] Office Visit from 01/15/2023 in [facility] Office Visit from 10/03/2022 in [facility]  AIMS Total Score 0 0 0      GAD-7    Flowsheet Row Office Visit from  05/15/2023 in [facility] Office Visit from 01/15/2023 in [facility] Office Visit from 10/03/2022 in [facility] Counselor from 08/19/2022 in [facility] Counselor from 07/24/2022 in [facility]  Total GAD-7 Score 4 6 3 5 4       PHQ2-9    Flowsheet Row Office Visit from 05/15/2023 in [facility] Office Visit from 01/15/2023 in [facility] Office Visit from 10/03/2022 in [facility] Counselor from 08/19/2022 in [facility] Counselor from 07/24/2022 in [facility]  PHQ-2 Total Score 2 0 1 1 0  PHQ-9 Total Score 7 1 3 4 3       Flowsheet Row Office Visit from 05/15/2023 in [facility] Office Visit from 01/15/2023 in [facility] Office Visit from 10/03/2022 in [facility]  C-SSRS RISK CATEGORY Low Risk No Risk No Risk        Assessment and Plan: Aaron Crosbyis a 52 year old Caucasian male, disability, lives in [place], married, has a history of anxiety, depression, was evaluated in office today.  Patient is currently stable.  Plan MDD in remission Cymbalta 30 mg p.o. daily Seroquel 200 mg p.o. nightly Discussed the addition of Cogentin again however not interested.  GAD-stable Continue Cymbalta 30 mg p.o. daily Patient to continue CBT as needed  Cannabis use disorder in early remission Will monitor closely  Polysubstance abuse-history of cocaine, LSD, alcohol abuse in remission-will monitor closely  High risk medication use-patient encouraged to get labs done  including hemoglobin A1c, prolactin level, TSH.  Lab slip printed out and provided to patient again.  Follow-up in clinic in 5 months or sooner if needed.   Collaboration of Care: Collaboration of Care: Primary Care Provider AEB patient would like to follow up with primary care provider to get labs done, encouraged to get it done.  Patient/Guardian was advised Release of Information must be obtained prior to any record release in order to collaborate their care with an outside provider. Patient/Guardian was advised if they have not already done so to contact the registration department to sign all necessary forms in order for [place] to release information regarding their care.   Consent: Patient/Guardian gives verbal consent for treatment and assignment of benefits for services provided during this visit. Patient/Guardian expressed understanding and agreed to proceed.  This note was generated in part or whole with voice recognition software. Voice recognition is usually quite accurate but there are transcription errors that can and very often do occur. I apologize for any typographical errors that were not detected and corrected.      Jomarie Longs, MD 05/16/2023, 1:27 PM

## 2023-06-11 DIAGNOSIS — D696 Thrombocytopenia, unspecified: Secondary | ICD-10-CM | POA: Insufficient documentation

## 2023-07-31 ENCOUNTER — Encounter: Payer: Self-pay | Admitting: Family Medicine

## 2023-07-31 ENCOUNTER — Other Ambulatory Visit: Payer: Self-pay | Admitting: Family Medicine

## 2023-07-31 DIAGNOSIS — M5412 Radiculopathy, cervical region: Secondary | ICD-10-CM

## 2023-07-31 DIAGNOSIS — Z8739 Personal history of other diseases of the musculoskeletal system and connective tissue: Secondary | ICD-10-CM

## 2023-07-31 DIAGNOSIS — R200 Anesthesia of skin: Secondary | ICD-10-CM

## 2023-08-08 ENCOUNTER — Ambulatory Visit
Admission: RE | Admit: 2023-08-08 | Discharge: 2023-08-08 | Disposition: A | Discharge status: Home / Self Care | Payer: Medicare Other | Source: Ambulatory Visit | Attending: Family Medicine | Admitting: Family Medicine

## 2023-08-08 DIAGNOSIS — M5412 Radiculopathy, cervical region: Secondary | ICD-10-CM | POA: Insufficient documentation

## 2023-08-08 DIAGNOSIS — R202 Paresthesia of skin: Secondary | ICD-10-CM | POA: Diagnosis present

## 2023-08-08 DIAGNOSIS — Z8739 Personal history of other diseases of the musculoskeletal system and connective tissue: Secondary | ICD-10-CM | POA: Diagnosis present

## 2023-08-08 DIAGNOSIS — R200 Anesthesia of skin: Secondary | ICD-10-CM | POA: Diagnosis present

## 2023-08-22 ENCOUNTER — Other Ambulatory Visit: Payer: Self-pay | Admitting: Orthopedic Surgery

## 2023-08-22 DIAGNOSIS — M4802 Spinal stenosis, cervical region: Secondary | ICD-10-CM

## 2023-09-07 ENCOUNTER — Ambulatory Visit
Admission: RE | Admit: 2023-09-07 | Discharge: 2023-09-07 | Disposition: A | Discharge status: Home / Self Care | Payer: Medicare Other | Source: Ambulatory Visit | Attending: Orthopedic Surgery | Admitting: Orthopedic Surgery

## 2023-09-07 DIAGNOSIS — M4802 Spinal stenosis, cervical region: Secondary | ICD-10-CM

## 2023-10-05 NOTE — Progress Notes (Unsigned)
 BH MD OP Progress Note  10/06/2023 11:02 AM Aaron D Helder Jr.  MRN:  [id number]  Chief Complaint:  Chief Complaint  Patient presents with   Follow-up   Depression   Anxiety   HPI: Aaron Crosbyis a 53 year old Caucasian male on disability, lives in [place], married, has a history of MDD, GAD, cannabis use disorder, polysubstance abuse-cocaine, alcohol, hallucinogen abuse in remission, hypertension, hyperlipidemia, obesity, chronic back pain was evaluated in office today.  He has been experiencing worsening neck pain over the past two and a half to three months. The pain is described as aching and stabbing, with a constant ache that disrupts sleep, walking, talking, and sitting.  For pain management, he has been prescribed gabapentin and occasionally takes muscle relaxants. He prefers to avoid medication due to side effects and ineffectiveness.  He rates his current pain as a 7 out of 10. No muscle spasms or rigidity from his medications.He has a history of cervical spine stenosis and has undergone multiple surgeries in the past, with the first surgery at age 23. Recovery from previous surgeries has been lengthy, with the last one taking almost a year.   He is currently taking Seroquel 200 mg at bedtime and Cymbalta 30 mg for his mood and sleep problems. He has tried higher doses of Cymbalta in the past but experienced side effects such as jitteriness and shakiness.  He is currently not interested in dosage readjustment of his medications or addition of new medications.  Although he is struggling with his mood symptoms he believes it is secondary to his pain and not being able to sleep because of the pain.  He has surgery scheduled for March 12 and he looks forward to it.  He also declines referral for psychotherapy.  He will get back to this provider if he is interested.  Denies suicidality, homicidality or perceptual disturbances.  He does report situational stressors from his daughter and  granddaughter who are not doing well.  He lives with his wife and his daughter, Aaron Crosby, lives [place] the farm but not in his house. He is trying to help her with disability due to mental health issues, but she has not been consistent with treatment.  That has been stressful for him.    He has been trying to lose weight and has lost a few pounds since October.  He quit using cannabis and is proud of himself.  Denies any alcohol use.    Visit Diagnosis:    ICD-10-CM   1. MDD (major depressive disorder), recurrent episode, mild (HCC)  F33.0     2. GAD (generalized anxiety disorder)  F41.1     3. Cannabis use disorder, mild, in early remission  F12.11     4. Polysubstance abuse (HCC)  F19.10    Per history cocaine, hallucinogen, alcohol      Past Psychiatric History: I have reviewed past psychiatric history from progress note on 06/04/2022.  Past Medical History:  Past Medical History:  Diagnosis Date   Anxiety    Depression     Past Surgical History:  Procedure Laterality Date   BACK SURGERY     CARPAL TUNNEL RELEASE      Family Psychiatric History: I have reviewed family psychiatric history from progress note on 06/04/2022.  Family History:  Family History  Problem Relation Age of Onset   Dementia Mother    Suicidality Cousin    Autism spectrum disorder Grandson    Drug abuse Daughter  Social History: I have reviewed social history from progress note on 06/04/2022. Social History   Socioeconomic History   Marital status: Married    Spouse name: Not on file   Number of children: 2   Years of education: Not on file   Highest education level: 8th grade  Occupational History   Not on file  Tobacco Use   Smoking status: Former   Smokeless tobacco: Never  Vaping Use   Vaping status: Never Used  Substance and Sexual Activity   Alcohol use: Yes    Alcohol/week: 6.0 standard drinks of alcohol    Types: 6 Cans of beer per week   Drug use: Yes    Types: Marijuana    Sexual activity: Yes    Birth control/protection: None  Other Topics Concern   Not on file  Social History Narrative   Not on file   Social Drivers of Health   Financial Resource Strain: Low Risk  (06/11/2023)   [facility] from [facility]   Overall Financial Resource Strain (CARDIA)    Difficulty of Paying Living Expenses: Not hard at all  Food Insecurity: No Food Insecurity (06/11/2023)   Received from [facility]   Hunger Vital Sign    Worried About Running Out of Food in the Last Year: Never true    Ran Out of Food in the Last Year: Never true  Transportation Needs: No Transportation Needs (06/11/2023)   Received from [facility] - Transportation    In the past 12 months, has lack of transportation kept you from medical appointments or from getting medications?: No    Lack of Transportation (Non-Medical): No  Physical Activity: Not on file  Stress: Not on file  Social Connections: Not on file    Allergies: No Known Allergies  Metabolic Disorder Labs: No results found for: "HGBA1C", "MPG" No results found for: "PROLACTIN" No results found for: "CHOL", "TRIG", "HDL", "CHOLHDL", "VLDL", "LDLCALC" No results found for: "TSH"  Therapeutic Level Labs: No results found for: "LITHIUM" No results found for: "VALPROATE" No results found for: "CBMZ"  Current Medications: Current Outpatient Medications  Medication Sig Dispense Refill   DULoxetine (CYMBALTA) 30 MG capsule Take 1 capsule (30 mg total) by mouth daily. Dose change 90 capsule 1   fluticasone (FLONASE) 50 MCG/ACT nasal spray Place into the nose.     lisinopril-hydrochlorothiazide (ZESTORETIC) 20-25 MG tablet Take 1 tablet by mouth daily.     omeprazole (PRILOSEC) 40 MG capsule Take 40 mg by mouth daily.     QUEtiapine (SEROQUEL) 200 MG tablet Take 1 tablet (200 mg total) by mouth at bedtime. 90 tablet 1   tadalafil (CIALIS) 10 MG tablet Take 10 mg by  mouth daily as needed for erectile dysfunction.     No current facility-administered medications for this visit.     Musculoskeletal: Strength & Muscle Tone: within normal limits Gait & Station: normal Patient leans: N/A  Psychiatric Specialty Exam: Review of Systems  Psychiatric/Behavioral:  Positive for dysphoric mood and sleep disturbance. The patient is nervous/anxious.     Blood pressure 124/76, pulse 89, temperature 98.4 F (36.9 C), temperature source Temporal, height 5\' 10"  (1.778 m), weight 236 lb 12.8 oz (107.4 kg), SpO2 99%.Body mass index is 33.98 kg/m.  General Appearance: Casual  Eye Contact:  Fair  Speech:  Clear and Coherent  Volume:  Normal  Mood:  Anxious, Dysphoric, and Irritable due to pain  Affect:  Congruent  Thought Process:  Goal Directed and Descriptions of Associations: Intact  Orientation:  Full (Time, Place, and Person)  Thought Content: Logical   Suicidal Thoughts:  No  Homicidal Thoughts:  No  Memory:  Immediate;   Fair Recent;   Fair Remote;   Fair  Judgement:  Fair  Insight:  Fair  Psychomotor Activity:  Normal  Concentration:  Concentration: Fair and Attention Span: Fair  Recall:  [employer] of Knowledge: Fair  Language: Fair  Akathisia:  No  Handed:  Left  AIMS (if indicated):  done  Assets:  Desire for Improvement Housing Social Support Transportation  ADL's:  Intact  Cognition: WNL  Sleep:   Restless due to pain   Screenings: AIMS    Loss adjuster, chartered Office Visit from 10/06/2023 in [facility] Office Visit from 05/15/2023 in [facility] Office Visit from 01/15/2023 in [facility] Office Visit from 10/03/2022 in [facility]  AIMS Total Score 0 0 0 0      GAD-7    Flowsheet Row Office Visit from 05/15/2023 in [facility] Office  Visit from 01/15/2023 in [facility] Office Visit from 10/03/2022 in [facility] Counselor from 08/19/2022 in [facility] Counselor from 07/24/2022 in [facility]  Total GAD-7 Score 4 6 3 5 4       PHQ2-9    Flowsheet Row Office Visit from 10/06/2023 in [facility] Office Visit from 05/15/2023 in [facility] Office Visit from 01/15/2023 in [facility] Office Visit from 10/03/2022 in [facility] Counselor from 08/19/2022 in [facility]  PHQ-2 Total Score 2 2 0 1 1  PHQ-9 Total Score 6 7 1 3 4       Flowsheet Row Office Visit from 10/06/2023 in [facility] Office Visit from 05/15/2023 in [facility] Office Visit from 01/15/2023 in [facility]  C-SSRS RISK CATEGORY Moderate Risk Low Risk No Risk        Assessment and Plan: JARRAD MCLEES Crosbyis a 53 year old Caucasian male, disability, lives in [place], married, has a history of anxiety, depression was evaluated in office today.  Discussed assessment and plan as noted below.  MDD-unstable GAD-unstable Mood symptoms exacerbated by chronic pain and family stressors. Currently on Seroquel 200 mg at bedtime and Cymbalta 30 mg daily. Higher doses of Cymbalta previously caused jitteriness and shakiness. Patient declines increase in medication dosage and is not interested in therapy due to past experiences with therapist turnover. Discussed potential benefits of therapy. - Continue Seroquel 200 mg at bedtime and Cymbalta 30 mg daily - Monitor mood symptoms and medication side  effects - Reassess interest in therapy in future visits - Discussed referral for CBT patient declines  Cannabis use disorder in remission Currently denies any cannabis use. - Continue to reassess in future sessions.  Polysubstance abuse per history-history of cocaine, LSD, alcoholism.  Continue to reassess in future sessions.  Pending labs-patient will benefit from repeat prolactin level, TSH, hemoglobin A1c.  Patient prefers to discuss with primary care provider.   Follow-up - Schedule follow-up appointment for end of May 2025.    Collaboration of Care: Collaboration of Care:  Patient refused AEB patient refused referral to CBT  Patient/Guardian was advised Release of Information must be obtained prior to any record release in order to collaborate their care with an outside provider. Patient/Guardian was advised if they have not already done so to contact the registration department to sign all necessary forms in order for [place] to release information regarding their care.   Consent: Patient/Guardian gives verbal consent for treatment and assignment of benefits for services provided during this visit. Patient/Guardian expressed understanding and agreed to proceed.  Discussed the use of a AI scribe software for clinical note transcription with the patient, who gave verbal consent to proceed.  This note was generated in part or whole with voice recognition software. Voice recognition is usually quite accurate but there are transcription errors that can and very often do occur. I apologize for any typographical errors that were not detected and corrected.     Jomarie Longs, MD 10/07/2023, 1:09 PM

## 2023-10-06 ENCOUNTER — Ambulatory Visit (INDEPENDENT_AMBULATORY_CARE_PROVIDER_SITE_OTHER): Payer: Medicare Other | Admitting: Psychiatry

## 2023-10-06 ENCOUNTER — Encounter: Payer: Self-pay | Admitting: Psychiatry

## 2023-10-06 VITALS — BP 124/76 | HR 89 | Temp 98.4°F | Ht 70.0 in | Wt 236.8 lb

## 2023-10-06 DIAGNOSIS — F330 Major depressive disorder, recurrent, mild: Secondary | ICD-10-CM

## 2023-10-06 DIAGNOSIS — F3342 Major depressive disorder, recurrent, in full remission: Secondary | ICD-10-CM

## 2023-10-06 DIAGNOSIS — F411 Generalized anxiety disorder: Secondary | ICD-10-CM | POA: Diagnosis not present

## 2023-10-06 DIAGNOSIS — F1211 Cannabis abuse, in remission: Secondary | ICD-10-CM

## 2023-10-06 DIAGNOSIS — F1910 Other psychoactive substance abuse, uncomplicated: Secondary | ICD-10-CM

## 2023-11-29 ENCOUNTER — Other Ambulatory Visit: Payer: Self-pay | Admitting: Psychiatry

## 2023-11-29 DIAGNOSIS — F3342 Major depressive disorder, recurrent, in full remission: Secondary | ICD-10-CM

## 2023-12-31 ENCOUNTER — Encounter: Payer: Self-pay | Admitting: Psychiatry

## 2023-12-31 ENCOUNTER — Ambulatory Visit (INDEPENDENT_AMBULATORY_CARE_PROVIDER_SITE_OTHER): Admitting: Psychiatry

## 2023-12-31 VITALS — BP 124/72 | HR 86 | Temp 98.2°F | Ht 70.0 in | Wt 228.6 lb

## 2023-12-31 DIAGNOSIS — F1910 Other psychoactive substance abuse, uncomplicated: Secondary | ICD-10-CM | POA: Diagnosis not present

## 2023-12-31 DIAGNOSIS — F411 Generalized anxiety disorder: Secondary | ICD-10-CM

## 2023-12-31 DIAGNOSIS — F1211 Cannabis abuse, in remission: Secondary | ICD-10-CM | POA: Diagnosis not present

## 2023-12-31 DIAGNOSIS — F3342 Major depressive disorder, recurrent, in full remission: Secondary | ICD-10-CM | POA: Diagnosis not present

## 2023-12-31 MED ORDER — DULOXETINE HCL 30 MG PO CPEP: 30.0000 mg | ORAL_CAPSULE | Freq: Every day | ORAL | 1 refills | Status: DC

## 2023-12-31 NOTE — Progress Notes (Unsigned)
 BH MD OP Progress Note  12/31/2023 10:56 AM Aaron D Goldblatt Jr.  MRN:  [id number]  Chief Complaint:  Chief Complaint  Patient presents with   Follow-up   Depression   Anxiety   Medication Refill    Discussed the use of AI scribe software for clinical note transcription with the patient, who gave verbal consent to proceed.  History of Present Illness Aaron Weekes. is a 53 year old Caucasian male on disability, lives in [place], married, has a history of MDD, GAD, cannabis use disorder, polysubstance abuse-cocaine, alcohol, hallucinogen abuse in remission, hypertension, hyperlipidemia, obesity, chronic back pain was evaluated in office today.  He underwent neck surgery approximately three months ago, which has improved his neck pain. Prior to surgery, the pain was described as 'stabbing.' He was previously on gabapentin and a muscle relaxant for pain management but is no longer taking these medications.   Since the surgery, he has been experiencing sleep disturbances. He has difficulty staying asleep, waking up around 2 AM due to pain, and feeling restless for the remainder of the night. He attributes the discomfort to his mattress. He takes Seroquel , which helps him fall asleep initially, but does not take any medication for pain at night. Occasionally, he uses ibuprofen or Tylenol .  He is on Cymbalta  30 mg and reports no significant issues with these medications. No symptoms such as muscle spasms, rigidity, tremors, stiffness, or dental problems. No significant depression or anxiety symptoms.  He completed a course of doxycycline for a tick bite that resulted in a leg infection. His platelet count was noted to be low in April. He was on an antibiotic for a tick bite and leg infection around that time. He is unsure when his platelets will be rechecked but plans to follow up with his provider.  He currently denies any suicidality, homicidality or perceptual disturbances.   Visit  Diagnosis:    ICD-10-CM   1. MDD (major depressive disorder), recurrent, in full remission (HCC)  F33.42     2. GAD (generalized anxiety disorder)  F41.1 DULoxetine  (CYMBALTA ) 30 MG capsule    3. Cannabis use disorder, mild, in early remission  F12.11     4. Polysubstance abuse (HCC)  F19.10    Per history of cocaine, hallucinogen or alcohol      Past Psychiatric History: I have reviewed past psychiatric history from progress note on 06/04/2022.  Past Medical History:  Past Medical History:  Diagnosis Date   Anxiety    Depression     Past Surgical History:  Procedure Laterality Date   BACK SURGERY     CARPAL TUNNEL RELEASE      Family Psychiatric History: I have reviewed family psychiatric history from progress note on 06/04/2022.  Family History:  Family History  Problem Relation Age of Onset   Dementia Mother    Suicidality Cousin    Autism spectrum disorder Grandson    Drug abuse Daughter     Social History: I have reviewed social history from progress note on 06/04/2022. Social History   Socioeconomic History   Marital status: Married    Spouse name: Not on file   Number of children: 2   Years of education: Not on file   Highest education level: 8th grade  Occupational History   Not on file  Tobacco Use   Smoking status: Former   Smokeless tobacco: Never  Vaping Use   Vaping status: Never Used  Substance and Sexual Activity   Alcohol  use: Yes    Alcohol/week: 6.0 standard drinks of alcohol    Types: 6 Cans of beer per week   Drug use: Yes    Types: Marijuana   Sexual activity: Yes    Birth control/protection: None  Other Topics Concern   Not on file  Social History Narrative   Not on file   Social Drivers of Health   Financial Resource Strain: Medium Risk (12/01/2023)   Received from [facility]   Overall Financial Resource Strain (CARDIA)    Difficulty of Paying Living Expenses: Somewhat hard  Food Insecurity: No Food  Insecurity (12/01/2023)   Received from [facility]   Hunger Vital Sign    Worried About Running Out of Food in the Last Year: Never true    Ran Out of Food in the Last Year: Never true  Transportation Needs: No Transportation Needs (12/01/2023)   Received from [facility] - Transportation    In the past 12 months, has lack of transportation kept you from medical appointments or from getting medications?: No    Lack of Transportation (Non-Medical): No  Physical Activity: Not on file  Stress: Not on file  Social Connections: Not on file    Allergies: No Known Allergies  Metabolic Disorder Labs: No results found for: "HGBA1C", "MPG" No results found for: "PROLACTIN" No results found for: "CHOL", "TRIG", "HDL", "CHOLHDL", "VLDL", "LDLCALC" No results found for: "TSH"  Therapeutic Level Labs: No results found for: "LITHIUM" No results found for: "VALPROATE" No results found for: "CBMZ"  Current Medications: Current Outpatient Medications  Medication Sig Dispense Refill   fluticasone (FLONASE) 50 MCG/ACT nasal spray Place into the nose.     lisinopril-hydrochlorothiazide (ZESTORETIC) 20-25 MG tablet Take 1 tablet by mouth daily.     omeprazole (PRILOSEC) 40 MG capsule Take 40 mg by mouth daily.     QUEtiapine  (SEROQUEL ) 200 MG tablet TAKE 1 TABLET BY MOUTH AT BEDTIME 90 tablet 0   tadalafil (CIALIS) 10 MG tablet Take 10 mg by mouth daily as needed for erectile dysfunction.     DULoxetine  (CYMBALTA ) 30 MG capsule Take 1 capsule (30 mg total) by mouth daily. Dose change 90 capsule 1   No current facility-administered medications for this visit.     Musculoskeletal: Strength & Muscle Tone: within normal limits Gait & Station: normal Patient leans: N/A  Psychiatric Specialty Exam: Review of Systems  Psychiatric/Behavioral:  Positive for sleep disturbance.     Blood pressure 124/72, pulse 86, temperature 98.2 F (36.8 C),  temperature source Temporal, height 5\' 10"  (1.778 m), weight 228 lb 9.6 oz (103.7 kg), SpO2 100%.Body mass index is 32.8 kg/m.  General Appearance: Casual  Eye Contact:  Good  Speech:  Clear and Coherent  Volume:  Normal  Mood:  Euthymic  Affect:  Congruent  Thought Process:  Goal Directed and Descriptions of Associations: Intact  Orientation:  Full (Time, Place, and Person)  Thought Content: Logical   Suicidal Thoughts:  No  Homicidal Thoughts:  No  Memory:  Immediate;   Fair Recent;   Fair Remote;   Fair  Judgement:  Fair  Insight:  Fair  Psychomotor Activity:  Normal  Concentration:  Concentration: Fair and Attention Span: Fair  Recall:  [employer] of Knowledge: Fair  Language: Fair  Akathisia:  No  Handed:  Right  AIMS (if indicated): done  Assets:  Communication Skills Desire for Improvement Housing Social Support Transportation  ADL's:  Intact  Cognition: WNL  Sleep:  Restless due to pain   Screenings: AIMS    Flowsheet Row Office Visit from 12/31/2023 in [facility] Office Visit from 10/06/2023 in [facility] Office Visit from 05/15/2023 in [facility] Office Visit from 01/15/2023 in [facility] Office Visit from 10/03/2022 in [facility]  AIMS Total Score 0 0 0 0 0      GAD-7    Flowsheet Row Office Visit from 12/31/2023 in [facility] Office Visit from 05/15/2023 in [facility] Office Visit from 01/15/2023 in [facility] Office Visit from 10/03/2022 in [facility] Counselor from 08/19/2022 in [facility]  Total GAD-7 Score 5 4 6 3 5       PHQ2-9    Flowsheet Row  Office Visit from 12/31/2023 in [facility] Office Visit from 10/06/2023 in [facility] Office Visit from 05/15/2023 in [facility] Office Visit from 01/15/2023 in [facility] Office Visit from 10/03/2022 in [facility]  PHQ-2 Total Score 0 2 2 0 1  PHQ-9 Total Score -- 6 7 1 3       Flowsheet Row Office Visit from 12/31/2023 in [facility] Office Visit from 10/06/2023 in [facility] Office Visit from 05/15/2023 in [facility]  C-SSRS RISK CATEGORY No Risk Moderate Risk Low Risk      Assessment and plan: Aaron Crosby. Is a 53 year old Caucasian male, currently on disability, lives in [place] was evaluated in office for depression, anxiety and sleep problems.  Discussed assessment and plan as noted below.  Major depressive disorder in remission Generalized anxiety disorder-stable Currently mood symptoms managed on the current medication regimen including Seroquel  and Cymbalta .  Does struggle with sleep mostly because of pain.  He is able to fall asleep on the Seroquel  however due to pain sleep is interrupted. - Continue Seroquel  200 mg at bedtime - Continue Cymbalta  30 mg daily - Will need sufficient pain management. - Reassess sleep problems in future sessions.  Cannabis use disorder in remission Currently denies any use. - Continue to reevaluate in future sessions.  Polysubstance abuse per history/history of cocaine, LSD, alcoholism - Continue to reevaluate in future sessions.  Currently in remission.  Most recent labs reviewed and discussed dated 11/24/2023-CBC with differential-RBC low at 4.57 platelet count low at 145.  Patient to follow up with primary care provider  for repeat labs to assess platelet count. Lipid panel-within normal limits, CMP-within normal limits.  Follow-up Follow-up in clinic in 3 months or sooner if needed.     Collaboration of Care: Collaboration of Care: Primary Care Provider AEB encouraged to follow up with primary care provider for repeat CBC with differential due to recent thrombocytopenia.  Patient/Guardian was advised Release of Information must be obtained prior to any record release in order to collaborate their care with an outside provider. Patient/Guardian was advised if they have not already done so to contact the registration department to sign all necessary forms in order for us  to release information regarding their care.   Consent: Patient/Guardian gives verbal consent for treatment and assignment of benefits for  services provided during this visit. Patient/Guardian expressed understanding and agreed to proceed.   This note was generated in part or whole with voice recognition software. Voice recognition is usually quite accurate but there are transcription errors that can and very often do occur. I apologize for any typographical errors that were not detected and corrected.   Leiliana Foody, MD 01/02/2024, 7:38 AM

## 2024-01-19 ENCOUNTER — Ambulatory Visit: Admitting: Surgery

## 2024-02-19 ENCOUNTER — Other Ambulatory Visit: Payer: Self-pay | Admitting: Physician Assistant

## 2024-02-19 DIAGNOSIS — R251 Tremor, unspecified: Secondary | ICD-10-CM

## 2024-02-19 DIAGNOSIS — R531 Weakness: Secondary | ICD-10-CM

## 2024-02-21 ENCOUNTER — Ambulatory Visit
Admission: RE | Admit: 2024-02-21 | Discharge: 2024-02-21 | Disposition: A | Discharge status: Home / Self Care | Source: Ambulatory Visit | Attending: Physician Assistant | Admitting: Physician Assistant

## 2024-02-21 DIAGNOSIS — R251 Tremor, unspecified: Secondary | ICD-10-CM | POA: Diagnosis present

## 2024-02-21 DIAGNOSIS — R531 Weakness: Secondary | ICD-10-CM | POA: Insufficient documentation

## 2024-02-21 MED ORDER — GADOBUTROL 1 MMOL/ML IV SOLN
10.0000 mL | Freq: Once | INTRAVENOUS | Status: AC | PRN
  Administered 2024-02-21: 10 mL via INTRAVENOUS

## 2024-03-02 ENCOUNTER — Other Ambulatory Visit: Payer: Self-pay | Admitting: Psychiatry

## 2024-03-02 DIAGNOSIS — F3342 Major depressive disorder, recurrent, in full remission: Secondary | ICD-10-CM

## 2024-03-23 ENCOUNTER — Encounter: Payer: Self-pay | Admitting: Psychiatry

## 2024-03-23 ENCOUNTER — Ambulatory Visit (INDEPENDENT_AMBULATORY_CARE_PROVIDER_SITE_OTHER): Admitting: Psychiatry

## 2024-03-23 VITALS — BP 131/80 | HR 76 | Temp 97.8°F | Ht 67.0 in | Wt 228.0 lb

## 2024-03-23 DIAGNOSIS — F3342 Major depressive disorder, recurrent, in full remission: Secondary | ICD-10-CM | POA: Diagnosis not present

## 2024-03-23 DIAGNOSIS — R251 Tremor, unspecified: Secondary | ICD-10-CM

## 2024-03-23 DIAGNOSIS — F411 Generalized anxiety disorder: Secondary | ICD-10-CM | POA: Diagnosis not present

## 2024-03-23 DIAGNOSIS — G4700 Insomnia, unspecified: Secondary | ICD-10-CM

## 2024-03-23 DIAGNOSIS — F1211 Cannabis abuse, in remission: Secondary | ICD-10-CM

## 2024-03-23 MED ORDER — QUETIAPINE FUMARATE 100 MG PO TABS: 100.0000 mg | ORAL_TABLET | Freq: Every day | ORAL | Status: DC

## 2024-03-23 MED ORDER — BENZTROPINE MESYLATE 0.5 MG PO TABS
0.5000 mg | ORAL_TABLET | Freq: Two times a day (BID) | ORAL | 0 refills | Status: DC
Start: 2024-03-23 — End: 2024-04-15

## 2024-03-23 MED ORDER — QUETIAPINE FUMARATE 50 MG PO TABS: 50.0000 mg | ORAL_TABLET | Freq: Every day | ORAL | 0 refills | Status: DC

## 2024-03-23 NOTE — Progress Notes (Unsigned)
 [facility] MD OP Progress Note  03/24/2024 6:29 PM Aaron D Tarnowski Jr.  MRN:  [id number]  Chief Complaint:  Chief Complaint  Patient presents with   Follow-up   Anxiety   Depression   Medication Refill   Discussed the use of AI scribe software for clinical note transcription with the patient, who gave verbal consent to proceed.  History of Present Illness Aaron Crosby. is a 53 year old Caucasian male on disability, lives in [place], married, has a history of MDD, GAD, cannabis use disorder, polysubstance abuse-cocaine, alcohol, hallucinogen abuse in remission, hypertension, hyperlipidemia, obesity, chronic back pain was evaluated in office today.  He has experienced tremors affecting his whole right side, including his arm and leg, since January 2025. Initially, he noticed these tremors intermittently, but now he experiences them daily, and he gets some relief with movement. He describes difficulty with fine motor tasks, such as starting a boat with his right hand, and sometimes he drags his right leg. He expresses frustration and concern about how these symptoms affect his daily activities.  He continues to take Seroquel  200 mg without recent changes until this visit.Discussed the possibility that Seroquel  may contribute to his tremors. He denies starting any new medications recently.  He continues to have ongoing sleep difficulties, and he states that he is not sleeping well. He is uncertain if the tremors affect his sleep, and he notes that his wife observes him sleeping with his arm raised frequently. He reports stable mood, anxiety, and appetite. He denies suicidal thoughts when asked.  He reports last alcohol use was Sunday evening, when he consumed 2 mixed drinks. He denies daily or regular alcohol use, binge drinking, and blackouts. He states past daily beer consumption but quit to lose weight. He denies current cannabis use.  He is currently not working. 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 He is planning to go on a cruise at the end of September. He lives with his spouse.  He has essential hypertension and obstructive sleep apnea. He reports a past surgical history of 2 neck surgeries and 2 back surgeries. He denies medication allergies.  Visit Diagnosis:    ICD-10-CM   1. MDD (major depressive disorder), recurrent, in full remission (HCC)  F33.42 QUEtiapine  (SEROQUEL ) 100 MG tablet    QUEtiapine  (SEROQUEL ) 50 MG tablet    2. GAD (generalized anxiety disorder)  F41.1 QUEtiapine  (SEROQUEL ) 100 MG tablet    3. Insomnia, unspecified type  G47.00     4. Tremor  R25.1 benztropine  (COGENTIN ) 0.5 MG tablet    5. Cannabis use disorder, mild, in early remission  F12.11       Past Psychiatric History: Reviewed past psychiatric history from progress note on 06/04/2022.  Past Medical History:  Past Medical History:  Diagnosis Date   Anxiety    Depression     Past Surgical History:  Procedure Laterality Date   BACK SURGERY     CARPAL TUNNEL RELEASE      Family Psychiatric History: I have reviewed family psychiatric history from progress note on 06/04/2022.  Family History:  Family History  Problem Relation Age of Onset   Dementia Mother    Suicidality Cousin    Autism spectrum disorder Grandson    Drug abuse Daughter     Social History: I have reviewed social history from progress note on 06/04/2022. Social History   Socioeconomic History   Marital status: Married    Spouse name: Not on file   Number of  children: 2   Years of education: Not on file   Highest education level: 8th grade  Occupational History   Not on file  Tobacco Use   Smoking status: Every Day    Types: Cigars   Smokeless tobacco: Never   Tobacco comments:    Has been smoking cigars, 2 a day for the past 3 months.   Vaping Use   Vaping status: Never Used  Substance and Sexual Activity   Alcohol use: Not Currently    Comment: Occasional mixed drink   Drug use: Not Currently     Types: Marijuana   Sexual activity: Yes    Birth control/protection: None  Other Topics Concern   Not on file  Social History Narrative   Not on file   Social Drivers of Health   Financial Resource Strain: Medium Risk (12/01/2023)   Received from [facility]   Overall Financial Resource Strain (CARDIA)    Difficulty of Paying Living Expenses: Somewhat hard  Food Insecurity: No Food Insecurity (12/01/2023)   [facility]   Hunger Vital Sign    Within the past 12 months, you worried that your food would run out before you got the money to buy more.: Never true    Within the past 12 months, the food you bought just didn't last and you didn't have money to get more.: Never true  Transportation Needs: No Transportation Needs (12/01/2023)   Received from [facility]    In the past 12 months, has lack of transportation kept you from medical appointments or from getting medications?: No    Lack of Transportation (Non-Medical): No  Physical Activity: Not on file  Stress: Not on file  Social Connections: Not on file    Allergies: No Known Allergies  Metabolic Disorder Labs: No results found for: HGBA1C, MPG No results found for: PROLACTIN No results found for: CHOL, TRIG, HDL, CHOLHDL, VLDL, LDLCALC No results found for: TSH  Therapeutic Level Labs: No results found for: LITHIUM No results found for: VALPROATE No results found for: CBMZ  Current Medications: Current Outpatient Medications  Medication Sig Dispense Refill   benztropine  (COGENTIN ) 0.5 MG tablet Take 1 tablet (0.5 mg total) by mouth 2 (two) times daily. For tremors 60 tablet 0   DULoxetine  (CYMBALTA ) 30 MG capsule Take 1 capsule (30 mg total) by mouth daily. Dose change 90 capsule 1   lisinopril-hydrochlorothiazide (ZESTORETIC) 20-25 MG tablet Take 1 tablet by mouth daily.     omeprazole  (PRILOSEC) 40 MG capsule Take 40 mg by mouth daily.     QUEtiapine  (SEROQUEL ) 100 MG tablet Take 1 tablet (100 mg total) by mouth at bedtime. Take along with 50 mg daily , (stop the 200 mg )     QUEtiapine  (SEROQUEL ) 50 MG tablet Take 1 tablet (50 mg total) by mouth at bedtime. Take along with 100 mg ( half of the 200 mg tabs) , total of 150 mg daily 30 tablet 0   tadalafil (CIALIS) 10 MG tablet Take 10 mg by mouth daily as needed for erectile dysfunction.     fluticasone (FLONASE) 50 MCG/ACT nasal spray Place into the nose. (Patient not taking: Reported on 03/23/2024)     No current facility-administered medications for this visit.     Musculoskeletal: Strength & Muscle Tone: within normal limits Gait & Station: normal Patient leans: N/A  Psychiatric Specialty Exam: Review of Systems  Neurological:  Positive  for tremors.  Psychiatric/Behavioral:  Positive for sleep disturbance. The patient is nervous/anxious.     Blood pressure 131/80, pulse 76, temperature 97.8 F (36.6 C), temperature source Temporal, height 5' 7 (1.702 m), weight 228 lb (103.4 kg), SpO2 97%.Body mass index is 35.71 kg/m.  General Appearance: Casual  Eye Contact:  Fair  Speech:  Clear and Coherent  Volume:  Normal  Mood:  Anxious  Affect:  Constricted  Thought Process:  Goal Directed and Descriptions of Associations: Intact  Orientation:  Full (Time, Place, and Person)  Thought Content: Logical   Suicidal Thoughts:  No  Homicidal Thoughts:  No  Memory:  Immediate;   Fair Recent;   Fair Remote;   Fair  Judgement:  Fair  Insight:  Fair  Psychomotor Activity:  Tremor Right UE and LE  Concentration:  Concentration: Fair and Attention Span: Fair  Recall:  [employer] of Knowledge: Fair  Language: Fair  Akathisia:  No  Handed:  Left  AIMS (if indicated): done  Assets:  Communication Skills Desire for Improvement Housing Social Support Talents/Skills Transportation  ADL's:  Intact  Cognition: WNL   Sleep:  Poor   Screenings: Geneticist, molecular Office Visit from 03/23/2024 in [facility] Office Visit from 12/31/2023 in [facility] Office Visit from 10/06/2023 in [facility] Office Visit from 05/15/2023 in [facility] Office Visit from 01/15/2023 in [facility]  AIMS Total Score 0 0 0 0 0   GAD-7    Flowsheet Row Office Visit from 03/23/2024 in [facility] Office Visit from 12/31/2023 in [facility] Office Visit from 05/15/2023 in [facility] Office Visit from 01/15/2023 in [facility] Office Visit from 10/03/2022 in [facility]  Total GAD-7 Score 1 5 4 6 3    PHQ2-9    Flowsheet Row Office Visit from 03/23/2024 in [facility] Office Visit from 12/31/2023 in [facility] Office Visit from 10/06/2023 in [facility] Office Visit from 05/15/2023 in [facility] Office Visit from 01/15/2023 in [facility]  PHQ-2 Total Score 1 0 2 2 0  PHQ-9 Total Score -- -- 6 7 1    Flowsheet Row Office Visit from 03/23/2024 in [facility] Office Visit from 12/31/2023 in [facility] Office Visit from 10/06/2023 in [facility]  C-SSRS RISK CATEGORY Moderate Risk No Risk Moderate Risk     Assessment and Plan: TYRIE PORZIO Crosbyis a 53 year old Caucasian male, currently on disability, lives in [place]  was evaluated in office today for a follow-up appointment.  Discussed assessment and plan as noted below.  Tremors-unspecified-unstable Currently struggling with right upper and lower extremity problems, ongoing since January 25 able to distract himself from his tremors which does help.  However reports problems as getting worse since the past few months.  Currently on Seroquel  unsure if this is contributing to it.  Reports that tremors are also present and sleep which likely affecting sleep.  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  Has upcoming appointment with neurology.  Referred by primary care provider. Encouraged to keep appointment with neurologist. Start Cogentin  0.5 mg twice a day.  Insomnia-unstable Currently sleep problems related to tremors per her report. Start Cogentin  as noted above. Will reevaluate in future sessions.  Generalized anxiety disorder-unstable Anxiety symptoms related to ongoing traumas and physical restrictions from the same. Reduce Seroquel  to 150 mg at bedtime.  Plan to taper it off. Continue Cymbalta  as prescribed.  MDD in remission Currently denies any significant depression symptoms. Continue Cymbalta  30 mg daily Continue Seroquel  with reduced dosage as noted above.  Cannabis use disorder in remission Currently denies any use Will reevaluate in future sessions.  Follow-up Follow-up in clinic in 2 weeks or sooner if needed.   Collaboration of Care: Collaboration of Care: Other encouraged to follow up with neurology.  Reviewed and discussed MRI brain dated 02/21/2024 as noted above.  Patient/Guardian was advised Release of Information must be obtained prior to any record release in order to collaborate their care with an outside provider. Patient/Guardian was advised if they have not already done so to contact the registration department to sign all necessary  forms in order for us  to release information regarding their care.   Consent: Patient/Guardian gives verbal consent for treatment and assignment of benefits for services provided during this visit. Patient/Guardian expressed understanding and agreed to proceed.   This note was generated in part or whole with voice recognition software. Voice recognition is usually quite accurate but there are transcription errors that can and very often do occur. I apologize for any typographical errors that were not detected and corrected.    Edelyn Heidel, MD 03/24/2024, 6:29 PM

## 2024-04-01 ENCOUNTER — Ambulatory Visit: Admitting: Psychiatry

## 2024-04-01 ENCOUNTER — Encounter: Payer: Self-pay | Admitting: Psychiatry

## 2024-04-01 ENCOUNTER — Ambulatory Visit (INDEPENDENT_AMBULATORY_CARE_PROVIDER_SITE_OTHER): Admitting: Psychiatry

## 2024-04-01 VITALS — BP 120/78 | HR 78 | Temp 98.6°F | Ht 67.0 in | Wt 228.6 lb

## 2024-04-01 DIAGNOSIS — F3342 Major depressive disorder, recurrent, in full remission: Secondary | ICD-10-CM

## 2024-04-01 DIAGNOSIS — F411 Generalized anxiety disorder: Secondary | ICD-10-CM

## 2024-04-01 DIAGNOSIS — R251 Tremor, unspecified: Secondary | ICD-10-CM | POA: Diagnosis not present

## 2024-04-01 DIAGNOSIS — F1211 Cannabis abuse, in remission: Secondary | ICD-10-CM

## 2024-04-01 DIAGNOSIS — G4700 Insomnia, unspecified: Secondary | ICD-10-CM

## 2024-04-01 MED ORDER — TRAZODONE HCL 100 MG PO TABS: 50.0000 mg | ORAL_TABLET | Freq: Every evening | ORAL | 1 refills | Status: DC | PRN

## 2024-04-01 MED ORDER — QUETIAPINE FUMARATE 50 MG PO TABS: 25.0000 mg | ORAL_TABLET | Freq: Every day | ORAL | Status: DC

## 2024-04-01 NOTE — Progress Notes (Unsigned)
 [facility] MD OP Progress Note  04/01/2024 9:49 AM Aaron D Warnick Jr.  MRN:  [id number]  Chief Complaint:  Chief Complaint  Patient presents with   Follow-up   Depression   Anxiety   Medication Problem   Medication Refill   Tremors   Discussed the use of AI scribe software for clinical note transcription with the patient, who gave verbal consent to proceed.  History of Present Illness Aaron Crosby. is a 53 year old Caucasian male on disability, lives in [place], married, has a history of MDD, GAD, cannabis use disorder, polysubstance abuse/cocaine, alcohol, hallucinogen abuse in remission, hypertension, hyperlipidemia, obesity, chronic back pain was evaluated in office today for a follow-up appointment.   He reports right-sided tremors in his arm and leg that began in January 2025, initially intermittent and worsening over time. Since starting Cogentin  (benztropine ), he has noticed improvement in the tremors, though they continue to occur intermittently. He currently takes Cogentin  for the tremors and has been tapering off Seroquel .  He does report dry mouth from the Cogentin .  He continues to experience ongoing sleep difficulties, stating he has not slept well for a while. His wife observes restlessness and tremors at night, which contribute to his sleep disturbance; she notes he shakes and holds his arm up during sleep. Seroquel  provides a couple of hours of sleep but causes side effects.  Agreeable to a trial of trazodone  and tapering of Seroquel .  He denies suicidal thoughts and denies thoughts of hurting others.  He does have upcoming appointment with neurology for evaluation of his tremors which he is motivated to keep.    Visit Diagnosis:    ICD-10-CM   1. MDD (major depressive disorder), recurrent, in full remission (HCC)  F33.42 QUEtiapine  (SEROQUEL ) 50 MG tablet    traZODone  (DESYREL ) 100 MG tablet    2. GAD (generalized anxiety disorder)  F41.1     3. Insomnia, unspecified  type  G47.00 traZODone  (DESYREL ) 100 MG tablet    4. Tremor  R25.1     5. Cannabis use disorder, mild, in early remission  F12.11       Past Psychiatric History: I have reviewed past psychiatric history from progress note on 06/04/2022.  Past Medical History:  Past Medical History:  Diagnosis Date   Anxiety    Depression     Past Surgical History:  Procedure Laterality Date   BACK SURGERY     CARPAL TUNNEL RELEASE      Family Psychiatric History: I have reviewed family psychiatric history from progress note on 06/04/2022.  Family History:  Family History  Problem Relation Age of Onset   Dementia Mother    Suicidality Cousin    Autism spectrum disorder Grandson    Drug abuse Daughter     Social History: I have reviewed social history from progress note on 06/04/2022. Social History   Socioeconomic History   Marital status: Married    Spouse name: Not on file   Number of children: 2   Years of education: Not on file   Highest education level: 8th grade  Occupational History   Not on file  Tobacco Use   Smoking status: Every Day    Types: Cigars   Smokeless tobacco: Never   Tobacco comments:    Has been smoking cigars, 2 a day for the past 3 months.   Vaping Use   Vaping status: Never Used  Substance and Sexual Activity   Alcohol use: Not Currently    Comment: Occasional  mixed drink   Drug use: Not Currently    Types: Marijuana   Sexual activity: Yes    Birth control/protection: None  Other Topics Concern   Not on file  Social History Narrative   Not on file   Social Drivers of Health   Financial Resource Strain: Medium Risk (12/01/2023)   Received from [facility]   Overall Financial Resource Strain (CARDIA)    Difficulty of Paying Living Expenses: Somewhat hard  Food Insecurity: No Food Insecurity (12/01/2023)   Received from [facility]   Hunger Vital Sign    Within the past 12 months, you worried that your  food would run out before you got the money to buy more.: Never true    Within the past 12 months, the food you bought just didn't last and you didn't have money to get more.: Never true  Transportation Needs: No Transportation Needs (12/01/2023)   Received from [facility]    In the past 12 months, has lack of transportation kept you from medical appointments or from getting medications?: No    Lack of Transportation (Non-Medical): No  Physical Activity: Not on file  Stress: Not on file  Social Connections: Not on file    Allergies: No Known Allergies  Metabolic Disorder Labs: No results found for: HGBA1C, MPG No results found for: PROLACTIN No results found for: CHOL, TRIG, HDL, CHOLHDL, VLDL, LDLCALC No results found for: TSH  Therapeutic Level Labs: No results found for: LITHIUM No results found for: VALPROATE No results found for: CBMZ  Current Medications: Current Outpatient Medications  Medication Sig Dispense Refill   benztropine  (COGENTIN ) 0.5 MG tablet Take 1 tablet (0.5 mg total) by mouth 2 (two) times daily. For tremors 60 tablet 0   DULoxetine  (CYMBALTA ) 30 MG capsule Take 1 capsule (30 mg total) by mouth daily. Dose change 90 capsule 1   fluticasone (FLONASE) 50 MCG/ACT nasal spray Place into the nose.     lisinopril-hydrochlorothiazide (ZESTORETIC) 20-25 MG tablet Take 1 tablet by mouth daily.     omeprazole (PRILOSEC) 40 MG capsule Take 40 mg by mouth daily.     tadalafil (CIALIS) 10 MG tablet Take 10 mg by mouth daily as needed for erectile dysfunction.     traZODone  (DESYREL ) 100 MG tablet Take 0.5-1 tablets (50-100 mg total) by mouth at bedtime as needed for sleep. 30 tablet 1   QUEtiapine  (SEROQUEL ) 50 MG tablet Take 0.5-1 tablets (25-50 mg total) by mouth at bedtime. Take 50 mg daily for 3 days and then 25 mg for 4 days and stop .     No current facility-administered medications for this  visit.     Musculoskeletal: Strength & Muscle Tone: within normal limits Gait & Station: normal Patient leans: N/A  Psychiatric Specialty Exam: Review of Systems  Neurological:  Positive for tremors.  Psychiatric/Behavioral:  Positive for sleep disturbance.     Blood pressure 120/78, pulse 78, temperature 98.6 F (37 C), temperature source Temporal, height 5' 7 (1.702 m), weight 228 lb 9.6 oz (103.7 kg), SpO2 100%.Body mass index is 35.8 kg/m.  General Appearance: Casual  Eye Contact:  Fair  Speech:  Clear and Coherent  Volume:  Normal  Mood:  Euthymic  Affect:  Congruent  Thought Process:  Goal Directed and Descriptions of Associations: Intact  Orientation:  Full (Time, Place, and Person)  Thought Content: Logical   Suicidal Thoughts:  No  Homicidal Thoughts:  No  Memory:  Immediate;   Fair Recent;   Fair Remote;   Fair  Judgement:  Fair  Insight:  Fair  Psychomotor Activity:  Tremor  Concentration:  Concentration: Fair and Attention Span: Fair  Recall:  [employer] of Knowledge: Fair  Language: Fair  Akathisia:  No  Handed:  Right  AIMS (if indicated): done  Assets:  Communication Skills Desire for Improvement Housing Social Support Transportation  ADL's:  Intact  Cognition: WNL  Sleep:  Poor   Screenings: Geneticist, molecular Office Visit from 04/01/2024 in [facility] Office Visit from 03/23/2024 in [facility] Office Visit from 12/31/2023 in [facility] Office Visit from 10/06/2023 in [facility] Office Visit from 05/15/2023 in [facility]  AIMS Total Score 0 0 0 0 0   GAD-7    Flowsheet Row Office Visit from 03/23/2024 in [facility] Office Visit from 12/31/2023 in [facility]  Office Visit from 05/15/2023 in [facility] Office Visit from 01/15/2023 in [facility] Office Visit from 10/03/2022 in [facility]  Total GAD-7 Score 1 5 4 6 3    PHQ2-9    Flowsheet Row Office Visit from 04/01/2024 in [facility] Office Visit from 03/23/2024 in [facility] Office Visit from 12/31/2023 in [facility] Office Visit from 10/06/2023 in [facility] Office Visit from 05/15/2023 in [facility]  PHQ-2 Total Score 0 1 0 2 2  PHQ-9 Total Score -- -- -- 6 7   Flowsheet Row Office Visit from 04/01/2024 in [facility] Office Visit from 03/23/2024 in [facility] Office Visit from 12/31/2023 in [facility]  C-SSRS RISK CATEGORY No Risk Moderate Risk No Risk     Assessment and Plan: Aaron Crosbyis a 53 year old Caucasian male, currently on disability, lives in [place] was evaluated in office today for follow-up.  Discussed assessment and plan as noted below.  Tremor-unstable Ongoing tremors of right upper and lower extremity although with improvement on the Cogentin .  Likely secondary to Seroquel  however does have upcoming appointment with neurology for consultation. Encouraged to keep appointment with neurologist coming up in September Continue Cogentin  0.5 mg twice a day Taper of Seroquel , take Seroquel  50 mg daily for 3 nights, reduce to Seroquel  25 mg after that for 4 nights and stop taking it.  Insomnia-unstable Currently reports sleep as affected and especially since Seroquel  is being tapered off and manage in addition of a new sleep  medication. Start Trazodone  50 to 100 mg at bedtime as needed  Generalized anxiety disorder-improving Currently reports anxiety is manageable. Continue Cymbalta  30 mg daily  MDD in remission Currently denies any significant depression symptoms Continue Cymbalta  30 mg daily  Follow-up Follow-up in clinic in 2 weeks or sooner if needed.  Collaboration of Care: Collaboration of Care: Other encouraged to keep appointment with neurologist.  Will coordinate care.  Patient/Guardian was advised Release of Information must be obtained prior to any record release in order to collaborate their care with an outside provider. Patient/Guardian was advised if they have not already done so to contact the  registration department to sign all necessary forms in order for us  to release information regarding their care.   Consent: Patient/Guardian gives verbal consent for treatment and assignment of benefits for services provided during this visit. Patient/Guardian expressed understanding and agreed to proceed.   This note was generated in part or whole with voice recognition software. Voice recognition is usually quite accurate but there are transcription errors that can and very often do occur. I apologize for any typographical errors that were not detected and corrected.    Shamaya Kauer, MD 04/02/2024, 6:35 AM

## 2024-04-01 NOTE — Patient Instructions (Signed)
 Trazodone  Tablets What is this medication? TRAZODONE  (TRAZ oh done) treats depression. It increases the amount of serotonin in the brain, a substance that helps regulate mood. This medicine may be used for other purposes; ask your health care provider or pharmacist if you have questions. COMMON BRAND NAME(S): Desyrel  What should I tell my care team before I take this medication? They need to know if you have any of these conditions: Bipolar disorder Bleeding disorder Glaucoma Heart disease, or previous heart attack Irregular heartbeat or rhythm Kidney disease Liver disease Low levels of sodium in the blood Suicidal thoughts, plans, or attempt by you or a family member An unusual or allergic reaction to trazodone , other medications, foods, dyes, or preservatives Pregnant or trying to get pregnant Breastfeeding How should I use this medication? Take this medication by mouth with a glass of water. Take it as directed on the prescription label at the same time every day. Take this medication shortly after a meal or a light snack. Keep taking this medication unless your care team tells you to stop. Stopping it too quickly can cause serious side effects. It can also make your condition worse. A special MedGuide will be given to you by the pharmacist with each prescription and refill. Be sure to read this information carefully each time. Talk to your care team about the use of this medication in children. Special care may be needed. Overdosage: If you think you have taken too much of this medicine contact a poison control center or emergency room at once. NOTE: This medicine is only for you. Do not share this medicine with others. What if I miss a dose? If you miss a dose, take it as soon as you can. If it is almost time for your next dose, take only that dose. Do not take double or extra doses. What may interact with this medication? Do not take this medication with any of the  following: Certain medications for fungal infections, such as fluconazole, itraconazole, ketoconazole, posaconazole, voriconazole Cisapride Dronedarone Linezolid MAOIs, such as Carbex, Eldepryl, Marplan, Nardil, and Parnate Mesoridazine Methylene blue (injected into a vein) Pimozide Saquinavir Thioridazine This medication may also interact with the following: Alcohol Antiviral medications for HIV or AIDS Aspirin and aspirin-like medications Barbiturates, such as phenobarbital Certain medications for blood pressure, heart disease, irregular heart beat Certain medications for mental health conditions Certain medications for migraine headache, such as almotriptan, eletriptan, frovatriptan, naratriptan, rizatriptan , sumatriptan, zolmitriptan Certain medications for seizures, such as carbamazepine and phenytoin Certain medications for sleep Certain medications that treat or prevent blood clots, such as dalteparin, enoxaparin, warfarin Digoxin Fentanyl  Lithium NSAIDS, medications for pain and inflammation, such as ibuprofen  or naproxen Other medications that cause heart rhythm changes Rasagiline Supplements, such as St. John's wort, kava kava, valerian Tramadol Tryptophan This list may not describe all possible interactions. Give your health care provider a list of all the medicines, herbs, non-prescription drugs, or dietary supplements you use. Also tell them if you smoke, drink alcohol, or use illegal drugs. Some items may interact with your medicine. What should I watch for while using this medication? Visit your care team for regular checks on your progress. Tell your care team if your symptoms do not start to get better or if they get worse. Because it may take several weeks to see the full effects of this medication, it is important to continue your treatment as prescribed by your care team. Watch for new or worsening thoughts of suicide or depression. This  includes sudden changes  in mood, behaviors, or thoughts. These changes can happen at any time but are more common in the beginning of treatment or after a change in dose. Call your care team right away if you experience these thoughts or worsening depression. This medication may cause mood and behavior changes, such as anxiety, nervousness, irritability, hostility, restlessness, excitability, hyperactivity, or trouble sleeping. These changes can happen at any time but are more common in the beginning of treatment or after a change in dose. Call your care team right away if you notice any of these symptoms. This medication may affect your coordination, reaction time, or judgment. Do not drive or operate machinery until you know how this medication affects you. Sit up or stand slowly to reduce the risk of dizzy or fainting spells. Drinking alcohol with this medication can increase the risk of these side effects. This medication may cause dry eyes and blurred vision. If you wear contact lenses you may feel some discomfort. Lubricating drops may help. See your care team if the problem does not go away or is severe. Your mouth may get dry. Chewing sugarless gum or sucking hard candy and drinking plenty of water may help. Contact your care team if the problem does not go away or is severe. What side effects may I notice from receiving this medication? Side effects that you should report to your care team as soon as possible: Allergic reactions--skin rash, itching, hives, swelling of the face, lips, tongue, or throat Bleeding--bloody or black, tar-like stools, red or dark brown urine, vomiting blood or brown material that looks like coffee grounds, small, red or purple spots on skin, unusual bleeding or bruising Heart rhythm changes--fast or irregular heartbeat, dizziness, feeling faint or lightheaded, chest pain, trouble breathing Low blood pressure--dizziness, feeling faint or lightheaded, blurry vision Low sodium level--muscle  weakness, fatigue, dizziness, headache, confusion Prolonged or painful erection Serotonin syndrome--irritability, confusion, fast or irregular heartbeat, muscle stiffness, twitching muscles, sweating, high fever, seizures, chills, vomiting, diarrhea Sudden eye pain or change in vision such as blurry vision, seeing halos around lights, vision loss Thoughts of suicide or self-harm, worsening mood, feelings of depression Side effects that usually do not require medical attention (report to your care team if they continue or are bothersome): Change in sex drive or performance Constipation Dizziness Drowsiness Dry mouth This list may not describe all possible side effects. Call your doctor for medical advice about side effects. You may report side effects to FDA at 1-800-FDA-1088. Where should I keep my medication? Keep out of the reach of children and pets. Store at room temperature between 15 and 30 degrees C (59 to 86 degrees F). Protect from light. Keep container tightly closed. Throw away any unused medication after the expiration date. NOTE: This sheet is a summary. It may not cover all possible information. If you have questions about this medicine, talk to your doctor, pharmacist, or health care provider.  2025 Elsevier/Gold Standard (2023-10-08 00:00:00)

## 2024-04-02 DIAGNOSIS — R251 Tremor, unspecified: Secondary | ICD-10-CM | POA: Insufficient documentation

## 2024-04-02 DIAGNOSIS — G4700 Insomnia, unspecified: Secondary | ICD-10-CM | POA: Insufficient documentation

## 2024-04-15 ENCOUNTER — Other Ambulatory Visit: Payer: Self-pay

## 2024-04-15 ENCOUNTER — Ambulatory Visit (INDEPENDENT_AMBULATORY_CARE_PROVIDER_SITE_OTHER): Admitting: Psychiatry

## 2024-04-15 ENCOUNTER — Encounter: Payer: Self-pay | Admitting: Psychiatry

## 2024-04-15 VITALS — BP 116/73 | HR 87 | Temp 97.8°F | Ht 67.0 in | Wt 226.6 lb

## 2024-04-15 DIAGNOSIS — F1211 Cannabis abuse, in remission: Secondary | ICD-10-CM

## 2024-04-15 DIAGNOSIS — F411 Generalized anxiety disorder: Secondary | ICD-10-CM | POA: Diagnosis not present

## 2024-04-15 DIAGNOSIS — R251 Tremor, unspecified: Secondary | ICD-10-CM | POA: Diagnosis not present

## 2024-04-15 DIAGNOSIS — G4700 Insomnia, unspecified: Secondary | ICD-10-CM | POA: Diagnosis not present

## 2024-04-15 DIAGNOSIS — F3342 Major depressive disorder, recurrent, in full remission: Secondary | ICD-10-CM

## 2024-04-15 MED ORDER — HYDROXYZINE HCL 10 MG PO TABS: 10.0000 mg | ORAL_TABLET | Freq: Three times a day (TID) | ORAL | 0 refills | Status: AC | PRN

## 2024-04-15 NOTE — Progress Notes (Signed)
 [facility] MD OP Progress Note  04/15/2024 2:52 PM Aaron D Jezewski Jr.  MRN:  [id number]  Chief Complaint:  Chief Complaint  Patient presents with   Follow-up   Depression   Anxiety   Medication Refill   Medication Problem   Insomnia   Discussed the use of AI scribe software for clinical note transcription with the patient, who gave verbal consent to proceed.  History of Present Illness Aaron Crosby. is a 53 year old Caucasian male on disability, lives in [place], married, has a history of MDD, GAD, cannabis use disorder polysubstance abuse/cocaine, alcohol, hallucinogen abuse in remission, hypertension, hyperlipidemia, obesity, chronic back pain was evaluated in office today for a follow-up appointment.  Since stopping Seroquel  approximately 10 days ago, he reports feeling on edge. Ongoing anxiety continues to affect him, and he requests a medication to help calm him on particularly difficult days. He expresses interest in a medication that he can use as needed for acute anxiety rather than taking it daily.  Agreeable to trial of hydroxyzine  as needed.  He continues to take Cymbalta  30 mg daily. For sleep, he takes trazodone  at half a tablet and describes waking up multiple times during the night, typically around 2-4 a.m., then getting up for the day at 5 a.m. He continues to experience persistent sleep difficulties despite trazodone , and he discusses the possibility of increasing the dose.   He denies thoughts of hurting himself or others. He reports staying away from people.  He reports cannabis use, stating that he has not quit but has not used in the past 2 days.  He had neurology consultation 04/13/2024, I have reviewed notes per Dr. Jannett Fairly, patient has parkinsonism, unknown etiology, started on carbidopa levodopa referred for skin biopsy.   Visit Diagnosis:    ICD-10-CM   1. MDD (major depressive disorder), recurrent, in full remission (HCC)  F33.42     2. GAD (generalized  anxiety disorder)  F41.1 hydrOXYzine  (ATARAX ) 10 MG tablet    3. Insomnia, unspecified type  G47.00     4. Tremor  R25.1     5. Cannabis use disorder, mild, in early remission  F12.11       Past Psychiatric History: I have reviewed past psychiatric history from progress note on 06/04/2022.  Past Medical History:  Past Medical History:  Diagnosis Date   Anxiety    Depression     Past Surgical History:  Procedure Laterality Date   BACK SURGERY     CARPAL TUNNEL RELEASE      Family Psychiatric History: I have reviewed family psychiatric history from progress note on 06/04/2022.  Family History:  Family History  Problem Relation Age of Onset   Dementia Mother    Suicidality Cousin    Autism spectrum disorder Grandson    Drug abuse Daughter     Social History: I have reviewed social history from progress note on 06/04/2022. Social History   Socioeconomic History   Marital status: Married    Spouse name: Not on file   Number of children: 2   Years of education: Not on file   Highest education level: 8th grade  Occupational History   Not on file  Tobacco Use   Smoking status: Every Day    Types: Cigars   Smokeless tobacco: Never   Tobacco comments:    Has been smoking cigars, 2 a day for the past 3 months.   Vaping Use   Vaping status: Never Used  Substance and Sexual  Activity   Alcohol use: Not Currently    Comment: Occasional mixed drink   Drug use: Not Currently    Types: Marijuana   Sexual activity: Yes    Birth control/protection: None  Other Topics Concern   Not on file  Social History Narrative   Not on file   Social Drivers of Health   Financial Resource Strain: Medium Risk (04/13/2024)   Received from [facility]   Overall Financial Resource Strain (CARDIA)    Difficulty of Paying Living Expenses: Somewhat hard  Food Insecurity: Food Insecurity Present (04/13/2024)   Received from [facility]   Hunger Vital  Sign    Within the past 12 months, you worried that your food would run out before you got the money to buy more.: Sometimes true    Within the past 12 months, the food you bought just didn't last and you didn't have money to get more.: Sometimes true  Transportation Needs: No Transportation Needs (04/13/2024)   Received from [facility] Transportation    In the past 12 months, has lack of transportation kept you from medical appointments or from getting medications?: No    Lack of Transportation (Non-Medical): No  Physical Activity: Not on file  Stress: Not on file  Social Connections: Not on file    Allergies: No Known Allergies  Metabolic Disorder Labs: No results found for: HGBA1C, MPG No results found for: PROLACTIN No results found for: CHOL, TRIG, HDL, CHOLHDL, VLDL, LDLCALC No results found for: TSH  Therapeutic Level Labs: No results found for: LITHIUM No results found for: VALPROATE No results found for: CBMZ  Current Medications: Current Outpatient Medications  Medication Sig Dispense Refill   carbidopa-levodopa (SINEMET IR) 25-100 MG tablet Take 1 tablet by mouth 3 (three) times daily.     hydrOXYzine  (ATARAX ) 10 MG tablet Take 1 tablet (10 mg total) by mouth 3 (three) times daily as needed for anxiety. 90 tablet 0   DULoxetine  (CYMBALTA ) 30 MG capsule Take 1 capsule (30 mg total) by mouth daily. Dose change 90 capsule 1   fluticasone (FLONASE) 50 MCG/ACT nasal spray Place into the nose.     lisinopril-hydrochlorothiazide (ZESTORETIC) 20-25 MG tablet Take 1 tablet by mouth daily.     omeprazole (PRILOSEC) 40 MG capsule Take 40 mg by mouth daily.     sodium chloride (OCEAN) 0.65 % nasal spray Place 1 spray into the nose.     tadalafil (CIALIS) 10 MG tablet Take 10 mg by mouth daily as needed for erectile dysfunction.     traZODone  (DESYREL ) 100 MG tablet Take 0.5-1 tablets (50-100 mg total) by mouth at bedtime as  needed for sleep. 30 tablet 1   No current facility-administered medications for this visit.     Musculoskeletal: Strength & Muscle Tone: within normal limits Gait & Station: normal Patient leans: N/A  Psychiatric Specialty Exam: Review of Systems  Neurological:  Positive for tremors.  Psychiatric/Behavioral:  Positive for sleep disturbance. The patient is nervous/anxious.     Blood pressure 116/73, pulse 87, temperature 97.8 F (36.6 C), temperature source Temporal, height 5' 7 (1.702 m), weight 226 lb 9.6 oz (102.8 kg).Body mass index is 35.49 kg/m.  General Appearance: Casual  Eye Contact:  Fair  Speech:  Clear and Coherent  Volume:  Normal  Mood:  Anxious  Affect:  Appropriate  Thought Process:  Goal Directed and Descriptions of Associations: Intact  Orientation:  Full (Time,  Place, and Person)  Thought Content: Logical   Suicidal Thoughts:  No  Homicidal Thoughts:  No  Memory:  Immediate;   Fair Recent;   Fair Remote;   Fair  Judgement:  Fair  Insight:  Fair  Psychomotor Activity:  Tremor  Concentration:  Concentration: Fair and Attention Span: Fair  Recall:  [employer] of Knowledge: Fair  Language: Fair  Akathisia:  No  Handed:  Right  AIMS (if indicated): done  Assets:  Communication Skills Desire for Improvement Housing Social Support Transportation  ADL's:  Intact  Cognition: WNL  Sleep:  Interrupted   Screenings: Geneticist, Molecular Office Visit from 04/15/2024 in [facility] Office Visit from 04/01/2024 in [facility] Visit from 03/23/2024 in [facility] Office Visit from 12/31/2023 in [facility] Office Visit from 10/06/2023 in [facility]  AIMS Total Score 0 0 0 0 0   GAD-7    Flowsheet Row Office Visit from 04/15/2024 in [facility] Office Visit from 03/23/2024 in [facility] Office Visit from 12/31/2023 in [facility] Office Visit from 05/15/2023 in [facility] Office Visit from 01/15/2023 in [facility]  Total GAD-7 Score 2 1 5 4 6    PHQ2-9    Flowsheet Row Office Visit from 04/15/2024 in [facility] Office Visit from 04/01/2024 in [facility] Office Visit from 03/23/2024 in [facility] Office Visit from 12/31/2023 in [facility] Office Visit from 10/06/2023 in [facility]  PHQ-2 Total Score 2 0 1 0 2  PHQ-9 Total Score 5 -- -- -- 6   Flowsheet Row Office Visit from 04/15/2024 in [facility] Office Visit from 04/01/2024 in [facility] Office Visit from 03/23/2024 in [facility]  C-SSRS RISK CATEGORY Moderate Risk No Risk Moderate Risk     Assessment and Plan: Aaron Crosbyis a 53 year old Caucasian male, presents for a follow-up appointment, discussed assessment and plan as noted below.  Tremor-improving Completed neurology consultation, currently on carbidopa levodopa.  Was able to successfully stop the Seroquel .  Has side effects to benztropine  and would like to discontinue this. Discontinue benztropine  for side effects Continue follow-up with neurology for further recommendations for tremors and continue carbidopa levodopa.  Insomnia-unstable Trazodone  does help although he is only taking 1/2 tablet and still reports interrupted sleep. Continue Trazodone  50 to 100 mg at bedtime as needed, encouraged to  take a 100 mg at bedtime.  Generalized anxiety disorder-unstable Currently reports anxiety which is intermittent/episodic since stopping the Seroquel . Start Hydroxyzine  10 mg 3 times a day as needed. Continue Cymbalta  30 mg daily  MDD in remission Currently denies any significant depression symptoms Continue Cymbalta  30 mg daily  Follow-up Follow-up in clinic in 7 weeks or sooner if needed.    Consent: Patient/Guardian gives verbal consent for treatment and assignment of benefits for services provided during this visit. Patient/Guardian expressed understanding and agreed to proceed.   This note was generated in part or whole with voice recognition software. Voice recognition is usually quite accurate but there are transcription errors that can and very often  do occur. I apologize for any typographical errors that were not detected and corrected.    Corlette Ciano, MD 04/16/2024, 8:14 AM

## 2024-05-26 ENCOUNTER — Ambulatory Visit: Admitting: Psychiatry

## 2024-05-26 ENCOUNTER — Other Ambulatory Visit: Payer: Self-pay

## 2024-05-26 ENCOUNTER — Encounter: Payer: Self-pay | Admitting: Psychiatry

## 2024-05-26 VITALS — BP 120/79 | HR 73 | Temp 97.6°F | Ht 67.0 in | Wt 225.6 lb

## 2024-05-26 DIAGNOSIS — G4700 Insomnia, unspecified: Secondary | ICD-10-CM | POA: Diagnosis not present

## 2024-05-26 DIAGNOSIS — F411 Generalized anxiety disorder: Secondary | ICD-10-CM

## 2024-05-26 DIAGNOSIS — F1211 Cannabis abuse, in remission: Secondary | ICD-10-CM

## 2024-05-26 DIAGNOSIS — F331 Major depressive disorder, recurrent, moderate: Secondary | ICD-10-CM | POA: Diagnosis not present

## 2024-05-26 MED ORDER — DULOXETINE HCL 30 MG PO CPEP
30.0000 mg | ORAL_CAPSULE | Freq: Two times a day (BID) | ORAL | 1 refills | Status: DC
Start: 2024-05-26 — End: 2024-06-14

## 2024-05-26 MED ORDER — TRAZODONE HCL 100 MG PO TABS: 50.0000 mg | ORAL_TABLET | Freq: Every evening | ORAL | 1 refills | Status: DC | PRN

## 2024-05-26 NOTE — Patient Instructions (Signed)
Serotonin Syndrome Serotonin is a chemical that helps to control several functions in the body. This chemical is also called a neurotransmitter. It controls: Brain and nerve cell function. Mood and emotions. Memory. Eating. Sleeping. Sexual activity. Stress response. Having too much serotonin in your body can cause serotonin syndrome. This condition can be harmful to your brain and nerve cells. This can be a life-threatening condition. What are the causes? This condition may be caused by taking medicines or drugs that increase the level of serotonin in your body, such as: Antidepressant medicines. Migraine medicines. Certain pain medicines. Certain drugs, including ecstasy, LSD, cocaine, and amphetamines. Over-the-counter cough or cold medicines that contain dextromethorphan. Certain herbal supplements, including St. John's wort, ginseng, and nutmeg. This condition usually occurs when you take these medicines or drugs together, but it can also happen with a high dose of a single medicine or drug. What increases the risk? You are more likely to develop this condition if: You just started taking a medicine or drug that increases the level of serotonin in the body. You recently increased the dose of a medicine or drug that increases the level of serotonin in the body. You take more than one medicine or drug that increases the level of serotonin in the body. What are the signs or symptoms? Symptoms of this condition usually start within several hours of taking a medicine or drug. Symptoms may be mild or severe. Mild symptoms include: Sweating. Restlessness or agitation. Muscle twitching or stiffness. Rapid heart rate. Nausea, vomiting, or diarrhea. Shivering or goose bumps. Confusion. Severe symptoms include: Irregular heartbeat. Seizures. Loss of consciousness. High fever. How is this diagnosed? This condition may be diagnosed based on: Your medical history. A physical  exam. Your prior use of drugs and medicines. Blood or urine tests. These may be used to rule out other causes of your symptoms. How is this treated? The treatment for this condition depends on the severity of your symptoms. For mild cases, stopping the medicine or drug that caused your condition is usually all that is needed. For moderate to severe cases, treatment in a hospital may be needed to prevent or treat life-threatening symptoms. Treatment may include: Medicines to control your symptoms. IV fluids. Actions to support your breathing. Treatments to control your body temperature. Follow these instructions at home: Medicines  Take over-the-counter and prescription medicines only as told by your health care provider. Check with your health care provider before you start taking any new prescriptions, over-the-counter medicines, herbs, or supplements. Do not combine any medicines that can cause this condition. Lifestyle  Maintain a healthy lifestyle. Eat a healthy diet that includes plenty of vegetables, fruits, whole grains, low-fat dairy products, and lean protein. Do not eat a lot of foods that are high in fat, added sugars, or salt. Get the right amount and quality of sleep. Most adults need 7-9 hours of sleep each night. Make time to exercise, even if it is only for short periods of time. Most adults should exercise for at least 150 minutes each week. Do not drink alcohol. Do not use illegal drugs. Do not take medicines for reasons other than they are prescribed. General instructions Do not use any products that contain nicotine or tobacco. These products include cigarettes, chewing tobacco, and vaping devices, such as e-cigarettes. If you need help quitting, ask your health care provider. Contact a health care provider if: Your symptoms do not improve or they get worse. Get help right away if: You have worsening  confusion, severe headache, chest pain, high fever, seizures, or  loss of consciousness. You experience serious side effects of medicine, such as swelling of your face, lips, tongue, or throat. These symptoms may be an emergency. Get help right away. Call 911. Do not wait to see if the symptoms will go away. Do not drive yourself to the hospital. Also, get help right away if: You have serious thoughts about hurting yourself or others. Take one of these steps if you feel like you may hurt yourself or others, or have thoughts about taking your own life: Go to your nearest emergency room. Call 911. Call the National Suicide Prevention Lifeline at 276-283-0524 or 988. This is open 24 hours a day. Text the Crisis Text Line at 337-730-6713. Summary Serotonin is a chemical that helps to control several functions in the body. High levels of serotonin in the body can cause serotonin syndrome, which can be life-threatening. This condition may be caused by taking medicines or drugs that increase the level of serotonin in your body. Treatment depends on the severity of your symptoms. For mild cases, stopping the medicine or drug that caused your condition is usually all that is needed. Check with your health care provider before you start taking any new prescriptions, over-the-counter medicines, herbs, or supplements. This information is not intended to replace advice given to you by your health care provider. Make sure you discuss any questions you have with your health care provider. Document Revised: 10/11/2021 Document Reviewed: 10/11/2021 Elsevier Patient Education  2024 [employer].

## 2024-05-26 NOTE — Progress Notes (Unsigned)
 [facility] MD OP Progress Note  05/26/2024 12:14 PM Aaron D Arai Jr.  MRN:  [id number]  Chief Complaint:  Chief Complaint  Patient presents with   Follow-up   Anxiety   Depression   Medication Refill   Discussed the use of AI scribe software for clinical note transcription with the patient, who gave verbal consent to proceed.  History of Present Illness Aaron Crosby. is a 53 year old Caucasian male on disability, lives in [place], married, has a history of MDD, GAD, cannabis use disorder, polysubstance abuse/cocaine, alcohol, hallucinogen abuse in remission, hypertension, hyperlipidemia, obesity, chronic back pain was evaluated in office today for a follow-up appointment.  He reports experiencing significant anxiety and worry, primarily related to uncertainty about his health and awaiting diagnostic results regarding his struggles. He reports that his daily anxiety has increased as his physical symptoms have worsened. Irritability and a short temper have also affected him, and he describes recently feeling the urge to follow someone home and physically confront them, though he did not act on this impulse. He acknowledges controlling his behavior but expresses frustration with his emotional state. He denies any current thoughts of hurting himself or others.  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 He reports a decline in cognitive functioning, stating that he has trouble thinking and focusing. He connects some of his mood symptoms to ongoing health concerns and the impact these have had on his daily functioning and independence.  He takes Cymbalta  (duloxetine ) 30 mg daily for depression and anxiety. He has hydroxyzine  10 mg available as needed for anxiety and agitation, but he reports that he has not been taking it  regularly and had forgotten about it. He uses trazodone  50-100 mg for sleep as needed. He no longer takes Seroquel . His neurologist prescribed an anti-Parkinsonian medication, carbidopa levodopa and he takes trazodone  at night for sleep as part of his regimen. He recently started B12 injections once weekly after seeing that his B12 was low. He expresses concern about adding new medications and prefers to manage with his current regimen when possible.  Support from his wife remains important, though he expresses frustration with the uncertainty of his diagnosis and the impact on his mental health. He is not currently engaged in therapy but expresses interest in support, though he feels hesitant about starting with new therapists.  He reports last cannabis use over 3 months ago. He states he has quit drinking, but notes he has not completely quit and has reduced his alcohol intake.    Visit Diagnosis:    ICD-10-CM   1. MDD (major depressive disorder), recurrent episode, moderate (HCC)  F33.1 traZODone  (DESYREL ) 100 MG tablet    2. GAD (generalized anxiety disorder)  F41.1 DULoxetine  (CYMBALTA ) 30 MG capsule    3. Insomnia, unspecified type  G47.00 traZODone  (DESYREL ) 100 MG tablet    4. Cannabis use disorder, mild, in early remission  F12.11       Past Psychiatric History: I have reviewed past psychiatric history from progress note on 06/04/2022.  Past Medical History:  Past Medical History:  Diagnosis Date   Anxiety    Depression     Past Surgical History:  Procedure Laterality Date   BACK SURGERY     CARPAL TUNNEL RELEASE      Family Psychiatric History: I have reviewed family psychiatric history from progress note on  06/04/2022.  Family History:  Family History  Problem Relation Age of Onset   Dementia Mother    Suicidality Cousin    Autism spectrum disorder Grandson    Drug abuse Daughter     Social History: I have reviewed social history from progress note on  06/04/2022. Social History   Socioeconomic History   Marital status: Married    Spouse name: Not on file   Number of children: 2   Years of education: Not on file   Highest education level: 8th grade  Occupational History   Not on file  Tobacco Use   Smoking status: Every Day    Types: Cigars   Smokeless tobacco: Never   Tobacco comments:    Has been smoking cigars, 2 a day for the past 3 months.   Vaping Use   Vaping status: Never Used  Substance and Sexual Activity   Alcohol use: Not Currently    Comment: Occasional mixed drink   Drug use: Not Currently    Types: Marijuana   Sexual activity: Yes    Birth control/protection: None  Other Topics Concern   Not on file  Social History Narrative   Not on file   Social Drivers of Health   Financial Resource Strain: Medium Risk (04/13/2024)   [facility]   Overall Financial Resource Strain (CARDIA)    Difficulty of Paying Living Expenses: Somewhat hard  Food Insecurity: Food Insecurity Present (04/13/2024)   [facility] from [facility]   Hunger Vital Sign    Within the past 12 months, you worried that your food would run out before you got the money to buy more.: Sometimes true    Within the past 12 months, the food you bought just didn't last and you didn't have money to get more.: Sometimes true  Transportation Needs: No Transportation Needs (04/13/2024)   Received from [facility] - Transportation    In the past 12 months, has lack of transportation kept you from medical appointments or from getting medications?: No    Lack of Transportation (Non-Medical): No  Physical Activity: Not on file  Stress: Not on file  Social Connections: Not on file    Allergies: No Known Allergies  Metabolic Disorder Labs: No results found for: HGBA1C, MPG No results found for: PROLACTIN No results found for: CHOL, TRIG, HDL, CHOLHDL, VLDL,  LDLCALC No results found for: TSH  Therapeutic Level Labs: No results found for: LITHIUM No results found for: VALPROATE No results found for: CBMZ  Current Medications: Current Outpatient Medications  Medication Sig Dispense Refill   carbidopa-levodopa (SINEMET IR) 25-100 MG tablet Take 1 tablet by mouth 3 (three) times daily.     DULoxetine  (CYMBALTA ) 30 MG capsule Take 1 capsule (30 mg total) by mouth 2 (two) times daily. Dose change 180 capsule 1   fluticasone (FLONASE) 50 MCG/ACT nasal spray Place into the nose.     hydrOXYzine  (ATARAX ) 10 MG tablet Take 1 tablet (10 mg total) by mouth 3 (three) times daily as needed for anxiety. 90 tablet 0   lisinopril-hydrochlorothiazide (ZESTORETIC) 20-25 MG tablet Take 1 tablet by mouth daily.     omeprazole (PRILOSEC) 40 MG capsule Take 40 mg by mouth daily.     sodium chloride (OCEAN) 0.65 % nasal spray Place 1 spray into the nose.     tadalafil (CIALIS) 10 MG tablet Take 10 mg by mouth daily as needed for erectile dysfunction.  traZODone  (DESYREL ) 100 MG tablet Take 0.5-1 tablets (50-100 mg total) by mouth at bedtime as needed for sleep. 30 tablet 1   No current facility-administered medications for this visit.     Musculoskeletal: Strength & Muscle Tone: within normal limits Gait & Station: normal Patient leans: N/A  Psychiatric Specialty Exam: Review of Systems  Psychiatric/Behavioral:  Positive for decreased concentration, dysphoric mood and sleep disturbance. The patient is nervous/anxious.     Blood pressure 120/79, pulse 73, temperature 97.6 F (36.4 C), temperature source Temporal, height 5' 7 (1.702 m), weight 225 lb 9.6 oz (102.3 kg).Body mass index is 35.33 kg/m.  General Appearance: Casual  Eye Contact:  Fair  Speech:  Normal Rate  Volume:  Normal  Mood:  Anxious and Depressed  Affect:  Depressed  Thought Process:  Goal Directed and Descriptions of Associations: Intact  Orientation:  Full (Time, Place,  and Person)  Thought Content: Logical   Suicidal Thoughts:  No  Homicidal Thoughts:  No  Memory:  Immediate;   Fair Recent;   Fair Remote;   Fair reports short-term memory problems  Judgement:  Fair  Insight:  Fair  Psychomotor Activity:  Tremor upper and lower extremities  Concentration:  Concentration: Fair and Attention Span: Fair  Recall:  [employer] of Knowledge: Fair  Language: Fair  Akathisia:  No  Handed:  Right  AIMS (if indicated): Does have tremors visible in session both upper and lower extremities.  Assets:  Communication Skills Desire for Improvement Housing Intimacy Social Support Talents/Skills  ADL's:  Intact  Cognition: WNL  Sleep:  poor since stopping the Seroquel , although trazodone  helps to some extent   Screenings: AIMS    Flowsheet Row Office Visit from 04/15/2024 in [facility] Office Visit from 04/01/2024 in [facility] Office Visit from 03/23/2024 in [facility] Office Visit from 12/31/2023 in [facility] Office Visit from 10/06/2023 in [facility]  AIMS Total Score 0 0 0 0 0   GAD-7    Flowsheet Row Office Visit from 04/15/2024 in [facility] Office Visit from 03/23/2024 in [facility] Office Visit from 12/31/2023 in [facility] Office Visit from 05/15/2023 in [facility] Office Visit from 01/15/2023 in [facility]  Total GAD-7 Score 2 1 5 4 6    PHQ2-9    Flowsheet Row Office Visit from 04/15/2024 in [facility] Office Visit from 04/01/2024 in [facility] Office Visit from  03/23/2024 in [facility] Office Visit from 12/31/2023 in [facility] Office Visit from 10/06/2023 in [facility]  PHQ-2 Total Score 2 0 1 0 2  PHQ-9 Total Score 5 -- -- -- 6   Flowsheet Row Office Visit from 04/15/2024 in [facility] Office Visit from 04/01/2024 in [facility] Office Visit from 03/23/2024 in [facility]  C-SSRS RISK CATEGORY Moderate Risk No Risk Moderate Risk     Assessment and Plan: Aaron Crosbyis a 53 year old Caucasian male who presented for a follow-up appointment, discussed assessment and plan as noted below.  1. MDD (major depressive disorder), recurrent episode, moderate (HCC)-unstable Currently reports worsening mood  symptoms mostly complicated by his ongoing tremors he is currently undergoing diagnostic evaluation and testing per neurology to rule out Parkinson's disease. Increase Cymbalta  to 30 mg twice daily Encouraged referral to therapist for psychotherapy, patient declines  2. GAD (generalized anxiety disorder)-unstable Ongoing anxiety mostly related to his health issues. Encouraged referral for therapy, patient declines Increase Cymbalta  to 30 mg twice daily Encouraged use of hydroxyzine  10 mg 3 times a day as needed  3. Insomnia, unspecified type-unstable Current sleep problems mostly related to his anxiety symptoms.  Seroquel  did not help with sleep previously.  Seroquel  was discontinued due to tremors.  He reports trazodone  which he is currently taking helps to some extent. Encouraged sleep hygiene techniques Continue trazodone  50-100 mg at bedtime Could also use hydroxyzine  at bedtime for sleep problems.  4. Cannabis use disorder, mild, in early remission Currently denies any use.  Follow-up Follow-up in  clinic in 3 weeks or sooner in person.  Collaboration of Care: Collaboration of Care: Patient refused AEB patient declines referral for psychotherapy at this time.  Patient/Guardian was advised Release of Information must be obtained prior to any record release in order to collaborate their care with an outside provider. Patient/Guardian was advised if they have not already done so to contact the registration department to sign all necessary forms in order for us  to release information regarding their care.   Consent: Patient/Guardian gives verbal consent for treatment and assignment of benefits for services provided during this visit. Patient/Guardian expressed understanding and agreed to proceed.   This note was generated in part or whole with voice recognition software. Voice recognition is usually quite accurate but there are transcription errors that can and very often do occur. I apologize for any typographical errors that were not detected and corrected.    Carlisia Geno, MD 05/26/2024, 12:14 PM

## 2024-05-30 ENCOUNTER — Other Ambulatory Visit: Payer: Self-pay | Admitting: Psychiatry

## 2024-05-30 DIAGNOSIS — F3342 Major depressive disorder, recurrent, in full remission: Secondary | ICD-10-CM

## 2024-06-01 DIAGNOSIS — E538 Deficiency of other specified B group vitamins: Secondary | ICD-10-CM | POA: Insufficient documentation

## 2024-06-14 ENCOUNTER — Ambulatory Visit (INDEPENDENT_AMBULATORY_CARE_PROVIDER_SITE_OTHER): Admitting: Psychiatry

## 2024-06-14 ENCOUNTER — Encounter: Payer: Self-pay | Admitting: Psychiatry

## 2024-06-14 ENCOUNTER — Other Ambulatory Visit: Payer: Self-pay

## 2024-06-14 VITALS — BP 137/87 | HR 65 | Temp 97.6°F | Ht 67.0 in | Wt 227.2 lb

## 2024-06-14 DIAGNOSIS — G4700 Insomnia, unspecified: Secondary | ICD-10-CM

## 2024-06-14 DIAGNOSIS — F411 Generalized anxiety disorder: Secondary | ICD-10-CM

## 2024-06-14 DIAGNOSIS — F1211 Cannabis abuse, in remission: Secondary | ICD-10-CM | POA: Diagnosis not present

## 2024-06-14 DIAGNOSIS — F3342 Major depressive disorder, recurrent, in full remission: Secondary | ICD-10-CM

## 2024-06-14 MED ORDER — DULOXETINE HCL 30 MG PO CPEP: 30.0000 mg | ORAL_CAPSULE | Freq: Every day | ORAL | Status: AC

## 2024-06-14 NOTE — Progress Notes (Unsigned)
 [facility] MD OP Progress Note  06/14/2024 4:54 PM Aaron D Winslett Jr.  MRN:  [id number]  Chief Complaint:  Chief Complaint  Patient presents with   Follow-up   Depression   Anxiety   Medication Refill   Discussed the use of AI scribe software for clinical note transcription with the patient, who gave verbal consent to proceed.  History of Present Illness Aaron Crosby. is a 53 year old Caucasian male on disability, lives in [place], married, has a history of MDD, GAD, cannabis use disorder, polysubstance abuse/cocaine, alcohol, hallucinogen abuse in remission, hypertension, hyperlipidemia, obesity, chronic back pain was evaluated in office today for a follow-up appointment.  He reports improvement in anxiety since the last visit and feeling better overall. He notes that receiving B12 injections contributed to this progress. He continues to take hydroxyzine  for anxiety, using it once or sometimes twice daily, and states that his anxiety has improved based on recent screening.  He denies experiencing significant depression, sadness, or low motivation. Today, he describes feeling good and notes progress compared to previous visits. 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  He continues to experience sleep disturbance, going to bed at 9:00 PM, waking up at 2:00 AM, and getting up at 5:00 AM. He takes trazodone  100 mg nightly (full tablet) for sleep and believes it has helped somewhat. He states that he can fall back asleep quickly and generally feels rested upon waking, though he anticipates that age-related changes may further impact his sleep in the future.  He increased his Cymbalta  dose as previously instructed but did not tolerate the change, describing a reversal of benefit. He currently takes Cymbalta  once daily. Hydroxyzine  for anxiety and trazodone  for sleep remain part of his regimen as  described above.  He denies current suicidal ideation, self-harm, or thoughts of harming others.    Visit Diagnosis:    ICD-10-CM   1. MDD (major depressive disorder), recurrent, in full remission  F33.42     2. GAD (generalized anxiety disorder)  F41.1 DULoxetine  (CYMBALTA ) 30 MG capsule    3. Insomnia, unspecified type  G47.00     4. Cannabis use disorder, mild, in early remission  F12.11       Past Psychiatric History: I have reviewed past psychiatric history from progress note on 06/04/2022.  Past trials of Seroquel .  Past Medical History:  Past Medical History:  Diagnosis Date   Anxiety    Depression     Past Surgical History:  Procedure Laterality Date   BACK SURGERY     CARPAL TUNNEL RELEASE      Family Psychiatric History: I have reviewed family psychiatric history from progress note on 06/04/2022.  Family History:  Family History  Problem Relation Age of Onset   Dementia Mother    Suicidality Cousin    Autism spectrum disorder Grandson    Drug abuse Daughter     Social History: I have reviewed social history from progress note on 06/04/2022. Social History   Socioeconomic History   Marital status: Married    Spouse name: Not on file   Number of children: 2   Years of education: Not on file   Highest education level: 8th grade  Occupational History   Not on file  Tobacco Use   Smoking status: Every Day    Types: Cigars   Smokeless tobacco: Never   Tobacco comments:  Has been smoking cigars, 2 a day for the past 3 months.   Vaping Use   Vaping status: Never Used  Substance and Sexual Activity   Alcohol use: Not Currently    Comment: Occasional mixed drink   Drug use: Not Currently    Types: Marijuana   Sexual activity: Yes    Birth control/protection: None  Other Topics Concern   Not on file  Social History Narrative   Not on file   Social Drivers of Health   Financial Resource Strain: Medium Risk (04/13/2024)   Received from [facility]   Overall Financial Resource Strain (CARDIA)    Difficulty of Paying Living Expenses: Somewhat hard  Food Insecurity: Food Insecurity Present (04/13/2024)   Received from [facility]   Hunger Vital Sign    Within the past 12 months, you worried that your food would run out before you got the money to buy more.: Sometimes true    Within the past 12 months, the food you bought just didn't last and you didn't have money to get more.: Sometimes true  Transportation Needs: No Transportation Needs (04/13/2024)   [facility]    In the past 12 months, has lack of transportation kept you from medical appointments or from getting medications?: No    Lack of Transportation (Non-Medical): No  Physical Activity: Not on file  Stress: Not on file  Social Connections: Not on file    Allergies: No Known Allergies  Metabolic Disorder Labs: No results found for: HGBA1C, MPG No results found for: PROLACTIN No results found for: CHOL, TRIG, HDL, CHOLHDL, VLDL, LDLCALC No results found for: TSH  Therapeutic Level Labs: No results found for: LITHIUM No results found for: VALPROATE No results found for: CBMZ  Current Medications: Current Outpatient Medications  Medication Sig Dispense Refill   carbidopa-levodopa (SINEMET IR) 25-100 MG tablet Take 1 tablet by mouth 3 (three) times daily.     DULoxetine  (CYMBALTA ) 30 MG capsule Take 1 capsule (30 mg total) by mouth daily. Dose change     fluticasone (FLONASE) 50 MCG/ACT nasal spray Place into the nose.     hydrOXYzine  (ATARAX ) 10 MG tablet Take 1 tablet (10 mg total) by mouth 3 (three) times daily as needed for anxiety. 90 tablet 0   lisinopril-hydrochlorothiazide (ZESTORETIC) 20-25 MG tablet Take 1 tablet by mouth daily.     omeprazole (PRILOSEC) 40 MG capsule Take 40 mg by mouth daily.     sodium chloride (OCEAN) 0.65 % nasal  spray Place 1 spray into the nose.     tadalafil (CIALIS) 10 MG tablet Take 10 mg by mouth daily as needed for erectile dysfunction.     traZODone  (DESYREL ) 100 MG tablet Take 0.5-1 tablets (50-100 mg total) by mouth at bedtime as needed for sleep. 30 tablet 1   No current facility-administered medications for this visit.     Musculoskeletal: Strength & Muscle Tone: within normal limits Gait & Station: normal Patient leans: N/A  Psychiatric Specialty Exam: Review of Systems  Psychiatric/Behavioral:  Positive for sleep disturbance (improving). The patient is nervous/anxious.     Blood pressure 137/87, pulse 65, temperature 97.6 F (36.4 C), temperature source Temporal, height 5' 7 (1.702 m), weight 227 lb 3.2 oz (103.1 kg).Body mass index is 35.58 kg/m.  General Appearance: Casual  Eye Contact:  Fair  Speech:  Clear and Coherent  Volume:  Normal  Mood:  Anxious  Affect:  Congruent  Thought Process:  Goal Directed and Descriptions of Associations: Intact  Orientation:  Full (Time, Place, and Person)  Thought Content: Logical   Suicidal Thoughts:  No  Homicidal Thoughts:  No  Memory:  Immediate;   Fair Recent;   Fair Remote;   Fair  Judgement:  Fair  Insight:  Fair  Psychomotor Activity:  Tremor  Concentration:  Concentration: Fair and Attention Span: Fair  Recall:  [employer] of Knowledge: Fair  Language: Fair  Akathisia:  No  Handed:  Right  AIMS (if indicated): tremor - UE , right , improving   Assets:  Communication Skills Desire for Improvement Housing Social Support  ADL's:  Intact  Cognition: WNL  Sleep:  improving   Screenings: Geneticist, Molecular Office Visit from 05/26/2024 in [facility] Office Visit from 04/15/2024 in [facility] Office Visit from 04/01/2024 in [facility] Office Visit from 03/23/2024 in [facility] Office Visit from 12/31/2023 in [facility]  AIMS Total Score 0 0 0 0 0   GAD-7    Flowsheet Row Office Visit from 06/14/2024 in [facility] Office Visit from 05/26/2024 in [facility] Office Visit from 04/15/2024 in [facility] Office Visit from 03/23/2024 in [facility] Office Visit from 12/31/2023 in [facility]  Total GAD-7 Score 4 6 2 1 5    PHQ2-9    Flowsheet Row Office Visit from 06/14/2024 in [facility] Office Visit from 05/26/2024 in [facility] Office Visit from 04/15/2024 in [facility] Office Visit from 04/01/2024 in [facility] Office Visit from 03/23/2024 in [facility]  PHQ-2 Total Score 0 0 2 0 1  PHQ-9 Total Score 3 6 5  -- --   Flowsheet Row Office Visit from 06/14/2024 in [facility] Office Visit from 05/26/2024 in [facility] Office Visit from 04/15/2024 in [facility]  C-SSRS RISK CATEGORY Moderate Risk Moderate Risk Moderate Risk     Assessment and Plan: Zaccary Creech. Is a 53 year old Caucasian male who presented for a follow-up appointment, discussed assessment and plan as noted below.  1. MDD (major depressive disorder), recurrent, in full remission Currently reports overall mood symptoms as improved denies any significant depression symptoms although does have ongoing sleep although improved.  Did not tolerate the higher dosage of Cymbalta . Continue Cymbalta  at reduced dosage of  30 mg daily Encouraged to establish care with therapist, patient declined.  2. GAD (generalized anxiety disorder)-improving Currently anxiety symptoms have improved, anxiety was mostly related to his health issues. Continue Cymbalta  30 mg daily Continue Hydroxyzine  10 mg 3 times a day as needed  3. Insomnia, unspecified type-improving Reports sleep has improved although it continues to be disrupted.  He is able to fall back asleep and reports feeling rested in the morning.  Sleep problems also likely due to the need to void. Discussed sleep hygiene techniques including limiting the amount of fluid intake at the end of the day. Continue Trazodone  50-100 mg at bedtime as needed  4. Cannabis use disorder, mild, in early remission Currently denies any use.  Patient with elevated blood pressure reading in session, blood pressure repeated, trending down.  Follow-up Follow-up in clinic in 5 to 6 weeks or sooner if needed.    Collaboration of Care: Collaboration of Care: Patient refused AEB patient declined referral for therapy.  Patient encouraged to continue follow-up with neurology.  Patient/Guardian was advised Release of Information must be obtained prior to any record release in order to collaborate their care with an outside provider. Patient/Guardian was advised if they have not already done so to contact the registration department to sign all necessary forms in order for us  to release information regarding their care.   Consent: Patient/Guardian gives verbal consent for treatment and assignment of benefits for services provided during this visit. Patient/Guardian expressed understanding and agreed to proceed.   This note was generated in part or whole with voice recognition software. Voice recognition is usually quite accurate but there are transcription errors that can and very often do occur. I apologize for any typographical errors that were not detected and  corrected.    Baird Polinski, MD 06/15/2024, 2:41 PM

## 2024-07-08 ENCOUNTER — Ambulatory Visit: Admitting: Psychiatry

## 2024-07-08 ENCOUNTER — Encounter: Payer: Self-pay | Admitting: Psychiatry

## 2024-07-08 ENCOUNTER — Other Ambulatory Visit: Payer: Self-pay

## 2024-07-08 VITALS — BP 128/82 | HR 78 | Temp 97.9°F | Ht 67.0 in | Wt 224.2 lb

## 2024-07-08 DIAGNOSIS — F1211 Cannabis abuse, in remission: Secondary | ICD-10-CM

## 2024-07-08 DIAGNOSIS — G4700 Insomnia, unspecified: Secondary | ICD-10-CM

## 2024-07-08 DIAGNOSIS — F3342 Major depressive disorder, recurrent, in full remission: Secondary | ICD-10-CM

## 2024-07-08 DIAGNOSIS — F411 Generalized anxiety disorder: Secondary | ICD-10-CM

## 2024-07-08 MED ORDER — TRAZODONE HCL 100 MG PO TABS: 150.0000 mg | ORAL_TABLET | Freq: Every evening | ORAL | 1 refills | Status: AC | PRN

## 2024-07-08 NOTE — Progress Notes (Signed)
 [facility] MD OP Progress Note  07/08/2024 11:05 AM Aaron D Nyce Jr.  MRN:  [id number]  Chief Complaint:  Chief Complaint  Patient presents with   Follow-up   Anxiety   Depression   Medication Refill   Discussed the use of AI scribe software for clinical note transcription with the patient, who gave verbal consent to proceed.  History of Present Illness Aaron Crosby. is a 53 year old Caucasian male on disability, lives in [place], married, has a history of MDD, GAD, cannabis use disorder, polysubstance abuse/cocaine, alcohol, hallucinogen abuse in remission, hypertension, hyperlipidemia, obesity, chronic back pain was evaluated in office today for a follow-up appointment.  Ongoing irritability and challenges managing anger prompt him to consciously think before reacting to frustrating situations. He explains that before starting medications like Cymbalta , he acted impulsively without considering consequences, but now feels more able to control his responses.  He continues to be worried about his tremors on a daily basis.  He reports the tremors get worse when he does work on projects which requires a lot of physical labor.  He is currently working with neurology and has upcoming appointment with Dr. Maree.  He recently completed a diagnostic biopsy to rule out Parkinson's disease and is currently elevating the result interpretation.   Persistent sleep disturbance affects him, as he falls asleep easily but wakes up around 1-2 a.m., returns to sleep, and then wakes again around 2-3 a.m. He reports that nothing specific causes him to wake up. His current regimen includes trazodone  at night for sleep and use of a CPAP machine. He also takes Cymbalta  30 mg once daily and hydroxyzine  as needed. He confirms a sufficient supply of Cymbalta  and trazodone . He denies drinking caffeinated beverages in the evening, though he occasionally drinks 2 eight-ounce cups of sugar-free Dr. Nunzio during the day.  He  denies any thoughts of hurting himself or others.  He reports last use of cannabis occurred five months ago. He denies current cannabis use.   He engages in walking for exercise. He enjoys cooking and prepared smoked turkey and corn for Thanksgiving. He interacts with friends and family.      Visit Diagnosis:    ICD-10-CM   1. MDD (major depressive disorder), recurrent, in full remission  F33.42     2. GAD (generalized anxiety disorder)  F41.1     3. Insomnia, unspecified type  G47.00 traZODone  (DESYREL ) 100 MG tablet    4. Cannabis use disorder, mild, in early remission  F12.11       Past Psychiatric History: I have reviewed past psychiatric history from progress note on 06/04/2022.  Past trials of Seroquel .  Past Medical History:  Past Medical History:  Diagnosis Date   Anxiety    Depression     Past Surgical History:  Procedure Laterality Date   BACK SURGERY     CARPAL TUNNEL RELEASE      Family Psychiatric History: I have reviewed family psychiatric history from progress note on 06/04/2022.  Family History:  Family History  Problem Relation Age of Onset   Dementia Mother    Suicidality Cousin    Autism spectrum disorder Grandson    Drug abuse Daughter     Social History: Reviewed social history from progress note on 06/04/2022. Social History   Socioeconomic History   Marital status: Married    Spouse name: Not on file   Number of children: 2   Years of education: Not on file   Highest education level:  8th grade  Occupational History   Not on file  Tobacco Use   Smoking status: Every Day    Types: Cigars   Smokeless tobacco: Never   Tobacco comments:    Has been smoking cigars, 2 a day for the past 3 months.   Vaping Use   Vaping status: Never Used  Substance and Sexual Activity   Alcohol use: Not Currently    Comment: Occasional mixed drink   Drug use: Not Currently    Types: Marijuana   Sexual activity: Yes    Birth control/protection:  None  Other Topics Concern   Not on file  Social History Narrative   Not on file   Social Drivers of Health   Financial Resource Strain: Medium Risk (04/13/2024)   [facility]   Overall Financial Resource Strain (CARDIA)    Difficulty of Paying Living Expenses: Somewhat hard  Food Insecurity: Food Insecurity Present (04/13/2024)   [facility]   Hunger Vital Sign    Within the past 12 months, you worried that your food would run out before you got the money to buy more.: Sometimes true    Within the past 12 months, the food you bought just didn't last and you didn't have money to get more.: Sometimes true  Transportation Needs: No Transportation Needs (04/13/2024)   Received from [facility]    In the past 12 months, has lack of transportation kept you from medical appointments or from getting medications?: No    Lack of Transportation (Non-Medical): No  Physical Activity: Not on file  Stress: Not on file  Social Connections: Not on file    Allergies: No Known Allergies  Metabolic Disorder Labs: No results found for: HGBA1C, MPG No results found for: PROLACTIN No results found for: CHOL, TRIG, HDL, CHOLHDL, VLDL, LDLCALC No results found for: TSH  Therapeutic Level Labs: No results found for: LITHIUM No results found for: VALPROATE No results found for: CBMZ  Current Medications: Current Outpatient Medications  Medication Sig Dispense Refill   carbidopa-levodopa (SINEMET IR) 25-100 MG tablet Take 1 tablet by mouth 3 (three) times daily.     DULoxetine  (CYMBALTA ) 30 MG capsule Take 1 capsule (30 mg total) by mouth daily. Dose change     fluticasone (FLONASE) 50 MCG/ACT nasal spray Place into the nose.     hydrOXYzine  (ATARAX ) 10 MG tablet Take 1 tablet (10 mg total) by mouth 3 (three) times daily as needed for anxiety. 90 tablet 0    lisinopril-hydrochlorothiazide (ZESTORETIC) 20-25 MG tablet Take 1 tablet by mouth daily.     omeprazole (PRILOSEC) 40 MG capsule Take 40 mg by mouth daily.     sodium chloride (OCEAN) 0.65 % nasal spray Place 1 spray into the nose.     tadalafil (CIALIS) 10 MG tablet Take 10 mg by mouth daily as needed for erectile dysfunction.     traZODone  (DESYREL ) 100 MG tablet Take 1.5 tablets (150 mg total) by mouth at bedtime as needed for sleep. 45 tablet 1   No current facility-administered medications for this visit.     Musculoskeletal: Strength & Muscle Tone: within normal limits Gait & Station: normal Patient leans: N/A  Psychiatric Specialty Exam: Review of Systems  Psychiatric/Behavioral:  Positive for sleep disturbance. The patient is nervous/anxious.     Blood pressure 128/82, pulse 78, temperature 97.9 F (36.6 C), temperature source Temporal, height 5' 7 (1.702 m),  weight 224 lb 3.2 oz (101.7 kg).Body mass index is 35.11 kg/m.  General Appearance: Fairly Groomed  Eye Contact:  Fair  Speech:  Clear and Coherent  Volume:  Normal  Mood:  Anxious  Affect:  Congruent  Thought Process:  Goal Directed and Descriptions of Associations: Intact  Orientation:  Full (Time, Place, and Person)  Thought Content: Logical   Suicidal Thoughts:  No  Homicidal Thoughts:  No  Memory:  Immediate;   Fair Recent;   Fair Remote;   Fair  Judgement:  Fair  Insight:  Fair  Psychomotor Activity:  Tremor upper extremities , also reports postural  Concentration:  Concentration: Fair and Attention Span: Fair  Recall:  [employer] of Knowledge: Fair  Language: Fair  Akathisia:  No  Handed:  Left  AIMS (if indicated): Has ongoing tremors of bilateral upper extremities  Assets:  Communication Skills Desire for Improvement Housing Intimacy Social Support Talents/Skills Transportation  ADL's:  Intact  Cognition: WNL  Sleep:  Poor   Screenings: Geneticist, Molecular Office Visit from  05/26/2024 in [facility] Office Visit from 04/15/2024 in [facility] Office Visit from 04/01/2024 in [facility] Office Visit from 03/23/2024 in [facility] Office Visit from 12/31/2023 in [facility]  AIMS Total Score 0 0 0 0 0   GAD-7    Flowsheet Row Office Visit from 07/08/2024 in [facility] Office Visit from 06/14/2024 in [facility] Office Visit from 05/26/2024 in [facility] Office Visit from 04/15/2024 in [facility] Office Visit from 03/23/2024 in [facility]  Total GAD-7 Score 3 4 6 2 1    PHQ2-9    Flowsheet Row Office Visit from 07/08/2024 in [facility] Office Visit from 06/14/2024 in [facility] Office Visit from 05/26/2024 in [facility] Office Visit from 04/15/2024 in [facility] Office Visit from 04/01/2024 in [facility]  PHQ-2 Total Score 0 0 0 2 0  PHQ-9 Total Score -- 3 6 5  --   Flowsheet Row Office Visit from 07/08/2024 in [facility] Office Visit from 06/14/2024 in [facility] Office Visit from 05/26/2024 in [facility]  C-SSRS RISK CATEGORY No Risk Moderate Risk Moderate Risk     Assessment and Plan: MANUEL DALL Crosbyis a 53 year old Caucasian male who presented for a follow-up appointment, discussed assessment and plan as noted below.  1. MDD (major  depressive disorder), recurrent, in full remission Currently reports overall mood symptoms as manageable on the current medication regimen. Continue Cymbalta  at reduced dose of 30 mg daily, did not tolerate the higher dosage.  2. GAD (generalized anxiety disorder)-improving Currently does have situational anxiety mostly related to current health problems.  Discussed referral for psychotherapy sessions patient declined. Continue Cymbalta  as prescribed Continue Hydroxyzine  10 mg 3 times a day as needed  3. Insomnia, unspecified type-unstable Currently reports sleep problems. Discussed sleep hygiene techniques including avoiding caffeinated drinks at the end of the day. Increase Trazodone  to 150 mg at bedtime as needed  4. Cannabis use disorder, mild, in early remission Currently denies any use. Will monitor closely.  Collaboration of Care: Collaboration of Care: Patient refused AEB patient declined referral for CBT Patient encouraged to continue to follow-up with neurology, Dr. Maree for management of tremors.  He has upcoming appointment on December 11.  Patient/Guardian was advised Release of Information must be obtained prior to any record release in order to collaborate their care with an outside provider. Patient/Guardian was advised if they have not already done so to contact the registration department to sign all necessary forms in order for us  to release information regarding their care.   Consent: Patient/Guardian gives verbal consent for treatment and assignment of benefits for services provided during this visit. Patient/Guardian expressed understanding and agreed to proceed.  This note was generated in part or whole with voice recognition software. Voice recognition is usually quite accurate but there are transcription errors that can and very often do occur. I apologize for any typographical errors that were not detected and corrected.     Manuelita Moxon, MD 07/09/2024,  7:51 AM

## 2024-07-31 ENCOUNTER — Other Ambulatory Visit: Payer: Self-pay | Admitting: Psychiatry

## 2024-07-31 DIAGNOSIS — G4700 Insomnia, unspecified: Secondary | ICD-10-CM

## 2024-09-07 ENCOUNTER — Other Ambulatory Visit: Payer: Self-pay

## 2024-09-07 ENCOUNTER — Encounter: Payer: Self-pay | Admitting: Psychiatry

## 2024-09-07 ENCOUNTER — Ambulatory Visit (INDEPENDENT_AMBULATORY_CARE_PROVIDER_SITE_OTHER): Admitting: Psychiatry

## 2024-09-07 VITALS — BP 129/78 | HR 88 | Temp 97.5°F | Ht 67.0 in | Wt 224.4 lb

## 2024-09-07 DIAGNOSIS — Z79899 Other long term (current) drug therapy: Secondary | ICD-10-CM

## 2024-09-07 DIAGNOSIS — G4700 Insomnia, unspecified: Secondary | ICD-10-CM | POA: Diagnosis not present

## 2024-09-07 DIAGNOSIS — F411 Generalized anxiety disorder: Secondary | ICD-10-CM | POA: Diagnosis not present

## 2024-09-07 DIAGNOSIS — F3342 Major depressive disorder, recurrent, in full remission: Secondary | ICD-10-CM | POA: Diagnosis not present

## 2024-09-07 DIAGNOSIS — F1211 Cannabis abuse, in remission: Secondary | ICD-10-CM

## 2024-09-07 NOTE — Patient Instructions (Signed)
 Clonazepam  Tablets What is this medication? CLONAZEPAM  (kloe NA ze pam) treats seizures. It is also used to treat panic disorder. It works by [employer] system calm down. It belongs to a group of medications called benzodiazepines. This medicine may be used for other purposes; ask your health care provider or pharmacist if you have questions. COMMON BRAND NAME(S): Ceberclon, Klonopin  What should I tell my care team before I take this medication? They need to know if you have any of these conditions: Glaucoma Kidney disease Liver disease Lung or breathing disease, such as asthma, COPD, sleep apnea Mental health conditions Porphyria Substance use disorder Suicidal thoughts, plans, or attempt by you or a family member Trouble swallowing An unusual or allergic reaction to clonazepam , other medications, foods, dyes, or preservatives Pregnant or trying to get pregnant Breastfeeding How should I use this medication? Take this medication by mouth with water . Take it as directed on the prescription label at the same time every day. You can take it with or without food. If it upsets your stomach, take it with food. Do not take it more often than directed. Keep taking it unless your care team tells you to stop. A special MedGuide will be given to you by the pharmacist with each prescription and refill. Be sure to read this information carefully each time. Talk to your care team about the use of this medication in children. Special care may be needed. People 65 years and older may have a stronger reaction and need a smaller dose. Overdosage: If you think you have taken too much of this medicine contact a poison control center or emergency room at once. NOTE: This medicine is only for you. Do not share this medicine with others. What if I miss a dose? If you miss a dose, take it as soon as you can. If it is almost time for your next dose, take only that dose. Do not take double or extra  doses. What may interact with this medication? Do not take this medication with any of the following: Sodium oxybate This medication may also interact with the following: Alcohol  Medications that cause drowsiness before a procedure, such as propofol  Medications that help you fall asleep Medications that relax muscles Opioids for pain or cough Other benzodiazepines Phenothiazines, such as chlorpromazine, prochlorperazine, thioridazine Some antihistamines Some medications for depression, such as amitriptyline or trazodone Some medications for seizures, such as phenobarbital or primidone Supplements, such as green tea, melatonin, St. John's wort, valerian Other medications may affect the way this medication works. Talk with your care team about all the medications you take. They may suggest changes to your treatment plan to lower the risk of side effects and to make sure your medications work as intended. This list may not describe all possible interactions. Give your health care provider a list of all the medicines, herbs, non-prescription drugs, or dietary supplements you use. Also tell them if you smoke, drink alcohol , or use illegal drugs. Some items may interact with your medicine. What should I watch for while using this medication? Visit your care team for regular checks on your progress. Tell your care team if your symptoms do not start to get better or if they get worse. There is a risk of abuse, misuse, and addiction with this medication. It is important to take this medication as directed by your care team. This medication may affect your coordination, reaction time, or judgment. Do not drive or operate machinery until you know how this  medication affects you. Sit up or stand slowly to reduce the risk of dizzy or fainting spells. Drinking alcohol  with this medication can increase the risk of these side effects. This medication is a CNS depressant. This is a type of medication or  substance that slows down your brain and nervous system. Taking it with other CNS depressants can make you too sleepy. This can make it hard to breathe and stay awake. In some cases, it can cause coma and death. CNS depressants include opioids, benzodiazepines, muscle relaxants, medications for sleep, alcohol , and street drugs. Talk to your care team about all the medications, vitamins, and supplements you take. They can tell you what is safe to take together. Call emergency services right away if you have slow or shallow breathing, feel dizzy or confused, or have trouble staying awake. Do not stop taking this medication or reduce your dose without first talking to your care team. If you have taken this medication for a long time or take a high dose, your body may rely on it. Stopping it suddenly may cause a severe reaction. Talk to your care team about how long you need to take this medication. When it is time to stop, the dose will be slowly lowered over time to reduce the risk of side effects. This medication may worsen depression and cause thoughts of suicide. This can happen at any time but is more common after first starting treatment and after a change in dose. Talk to your care team right away if you have changes in mood and behavior or thoughts of self-harm or suicide. They can help you. Talk to your care team if you may be pregnant. Serious fetal side effects can occur if you take this medication during pregnancy. Prolonged use of this medication during pregnancy can cause temporary withdrawal in a newborn. Talk to your care team before breastfeeding. Changes to your treatment plan may be needed. If you breastfeed while taking this medication, seek medical care right away if you notice the child has slow or noisy breathing, is unusually sleepy or not able to wake up, or is limp. What side effects may I notice from receiving this medication? Side effects that you should report to your care team as  soon as possible: Allergic reactions--skin rash, itching, hives, swelling of the face, lips, tongue, or throat CNS depression--slow or shallow breathing, shortness of breath, feeling faint, dizziness, confusion, trouble staying awake Thoughts of suicide or self-harm, worsening mood, feelings of depression Side effects that usually do not require medical attention (report these to your care team if they continue or are bothersome): Dizziness Drowsiness Headache This list may not describe all possible side effects. Call your doctor for medical advice about side effects. You may report side effects to FDA at 1-800-FDA-1088. Where should I keep my medication? Keep out of the reach of children and pets. Store it out of sight in a safe place. Do not share it with others. Misuse of this medication is dangerous and against the law. Store at room temperature between 20 and 25 degrees C (68 and 77 degrees F). Get rid of any unused medication after the expiration date. This medication may cause harm and death if it is taken by other adults, children, or pets. It is important to get rid of the medication as soon as you no longer need it, or it is expired. To get rid of this medication: Take the medication to a take-back program. Check with your pharmacy or law  enforcement to find a location. Follow the steps given to you by your pharmacy. You may be given a pre-paid mail-back envelope or disposal product to safely get rid of your medication. If other options are not available, check the package insert or medication guide to see if it should be flushed down the toilet or put in your trash at home. If you are not sure, ask your care team. If it is safe to put it in your trash, empty the medication out of the container. Mix it with cat litter, dirt, used coffee grounds, or another unwanted substance. Seal the mixture in a container, such as a plastic bag. Put it in the trash. NOTE: This sheet is a summary. It may  not cover all possible information. If you have questions about this medicine, talk to your doctor, pharmacist, or health care provider.  2025 [employer] Standard (2023-11-13 00:00:00)

## 2024-10-14 ENCOUNTER — Ambulatory Visit: Admitting: Licensed Clinical Social Worker

## 2024-12-06 ENCOUNTER — Ambulatory Visit: Admitting: Psychiatry
